# Patient Record
Sex: Male | Born: 1940
Health system: Southern US, Community
[De-identification: ages and names within clinical notes are randomized; demographics above are authoritative.]

## PROBLEM LIST (undated history)

## (undated) DIAGNOSIS — E119 Type 2 diabetes mellitus without complications: Secondary | ICD-10-CM

## (undated) DIAGNOSIS — Z87442 Personal history of urinary calculi: Secondary | ICD-10-CM

## (undated) DIAGNOSIS — E039 Hypothyroidism, unspecified: Secondary | ICD-10-CM

## (undated) DIAGNOSIS — I10 Essential (primary) hypertension: Secondary | ICD-10-CM

## (undated) DIAGNOSIS — N400 Enlarged prostate without lower urinary tract symptoms: Secondary | ICD-10-CM

## (undated) DIAGNOSIS — Q043 Other reduction deformities of brain: Secondary | ICD-10-CM

## (undated) DIAGNOSIS — E7800 Pure hypercholesterolemia, unspecified: Secondary | ICD-10-CM

## (undated) HISTORY — PX: PARATHYROIDECTOMY: SHX19

## (undated) HISTORY — PX: CHOLECYSTECTOMY: SHX55

## (undated) HISTORY — PX: LITHOTRIPSY: SUR834

---

## 2002-04-03 ENCOUNTER — Encounter: Payer: Self-pay | Admitting: Emergency Medicine

## 2002-04-03 ENCOUNTER — Emergency Department (HOSPITAL_COMMUNITY): Admission: EM | Admit: 2002-04-03 | Discharge: 2002-04-03 | Payer: Self-pay | Admitting: Emergency Medicine

## 2003-11-24 ENCOUNTER — Ambulatory Visit (HOSPITAL_COMMUNITY): Admission: RE | Admit: 2003-11-24 | Discharge: 2003-11-24 | Payer: Self-pay | Admitting: Gastroenterology

## 2004-05-25 ENCOUNTER — Emergency Department (HOSPITAL_COMMUNITY): Admission: EM | Admit: 2004-05-25 | Discharge: 2004-05-25 | Payer: Self-pay | Admitting: Emergency Medicine

## 2004-05-30 ENCOUNTER — Ambulatory Visit (HOSPITAL_COMMUNITY): Admission: RE | Admit: 2004-05-30 | Discharge: 2004-05-30 | Payer: Self-pay | Admitting: Urology

## 2004-11-25 ENCOUNTER — Emergency Department (HOSPITAL_COMMUNITY): Admission: EM | Admit: 2004-11-25 | Discharge: 2004-11-25 | Payer: Self-pay | Admitting: Emergency Medicine

## 2005-04-28 ENCOUNTER — Encounter (HOSPITAL_COMMUNITY): Admission: RE | Admit: 2005-04-28 | Discharge: 2005-07-08 | Payer: Self-pay | Admitting: Surgery

## 2005-06-30 ENCOUNTER — Ambulatory Visit (HOSPITAL_COMMUNITY): Admission: RE | Admit: 2005-06-30 | Discharge: 2005-07-01 | Payer: Self-pay | Admitting: Surgery

## 2005-06-30 ENCOUNTER — Encounter (INDEPENDENT_AMBULATORY_CARE_PROVIDER_SITE_OTHER): Payer: Self-pay | Admitting: Specialist

## 2005-07-14 HISTORY — PX: ANTERIOR CERVICAL DECOMP/DISCECTOMY FUSION: SHX1161

## 2006-02-17 ENCOUNTER — Encounter: Admission: RE | Admit: 2006-02-17 | Discharge: 2006-02-17 | Payer: Self-pay | Admitting: Internal Medicine

## 2006-03-20 ENCOUNTER — Inpatient Hospital Stay (HOSPITAL_COMMUNITY): Admission: RE | Admit: 2006-03-20 | Discharge: 2006-03-21 | Payer: Self-pay | Admitting: Neurological Surgery

## 2006-04-07 ENCOUNTER — Encounter: Admission: RE | Admit: 2006-04-07 | Discharge: 2006-04-07 | Payer: Self-pay | Admitting: Neurological Surgery

## 2006-06-01 ENCOUNTER — Encounter: Admission: RE | Admit: 2006-06-01 | Discharge: 2006-06-01 | Payer: Self-pay | Admitting: Neurological Surgery

## 2008-01-29 ENCOUNTER — Emergency Department (HOSPITAL_COMMUNITY): Admission: EM | Admit: 2008-01-29 | Discharge: 2008-01-29 | Payer: Self-pay | Admitting: Emergency Medicine

## 2010-11-26 NOTE — Consult Note (Signed)
[name], [name] NO.:  [id number]   MEDICAL RECORD NO.:  [id number]          PATIENT TYPE:  EMS   LOCATION:  MAJO                         FACILITY:  [facility]   PHYSICIAN:  Nena Polio, MD DATE OF BIRTH:  [birth year]   DATE OF CONSULTATION:  01/29/2008  DATE OF DISCHARGE:  01/29/2008                                 CONSULTATION   REASON FOR CONSULTATION:  Seen in consultation at the request of Dr.  Bebe Shaggy for further evaluation of a facial droop.   HISTORY OF PRESENT ILLNESS:  The patient is a very pleasant 70 year old  gentleman who came to the emergency room tonight with a 3-day history of  a left facial droop.  He noticed that initially he had a metallic taste  predominantly over the left side of his tongue.  He also developed some  pain over the left ear and mastoid area as well as some tearing in the  left eye.  Because his symptoms have persisted he came to the emergency  room tonight.  He denies any other symptoms of weakness, numbness,  speech, or swallowing changes.  The patient had a head CT done in the  emergency room which does show a small punctate hyperdensity in the  right Sylvian fissure that was thought to potentially represent blood in  the right NCA; however, this was very nonspecific per radiology.   CURRENT MEDICATIONS:  Accuretic, Serzone, and Synthroid.   PAST MEDICAL HISTORY:  1. Hypertension.  2. Hypothyroidism.  3. 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  4. History of a pinched nerve in the cervical spine, status post      surgery two years ago.  He has subsequent persistent numbness in      the left first 3 digits of the left hand.   SOCIAL/FAMILY HISTORY:  He is married.  He is retired.  They live here  in [place].  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   FAMILY HISTORY:  Benign.  His mother is still alive at 4, who did have  a stroke in her older age.  She is still living at home.   REVIEW OF SYSTEMS:   Please see H&P.  The remainder of 10 systems is  negative.   PHYSICAL EXAMINATION:  GENERAL:  A very pleasant gentleman in no acute  distress.  VITAL SIGNS:  He is afebrile.  Blood pressure is 140/80, pulse 53,  respirations 16.  HEENT:  Head is normocephalic, atraumatic.  Oropharynx moist with  exudate.  NECK:  Supple.  No carotid bruits.  HEART:  Regular rhythm, S1/S2.  ABDOMEN:  Soft, nondistended.  EXTREMITIES:  Showed no edema.  SKIN:  Warm, dry and without rash.  NEUROLOGIC:  The patient was alert and oriented x3.  His speech is  fluent.  He has a very mild dysarthria.  On cranial nerve exam his  pupils are reactive to light.  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  He has  good vertical movements bilaterally.  His facial sensation is intact.  He does have a mild to moderate peripheral seventh palsy with weakness  of all three distributions including weakness of eye closure and  decreased blinking on the left.  His sensation to light touch on the  tongue was intact.  Hearing was otherwise intact grossly.  Palate  elevation was symmetric, shoulder shrug intact.  Tongue was midline.  On  motor exam the patient has normal tone and bulk throughout.  He had 5/5  strength proximally and distally in all major muscle groups.  Sensory  exam was intact in all of his extremities with the exception of the  first three digits of the left hand, mild decrease to light touch and  temperature.  His reflexes were symmetric and nonpathologic.  Gait was  unremarkable.   IMPRESSION:  1. Left peripheral seventh palsy.  2. Abnormal finding on MRI that is likely incidental.  I do not see      any other symptoms on his exam to suggest a central ischemia at      this time.   RECOMMENDATIONS:  At this time I would treat for Bell palsy with  prednisone plus or minus acyclovir.  I would give him an eye patch at  night to prevent his left  eye from becoming dry and irritated.  If the  patient gets worse and/or his symptoms persist over the next three weeks  I would recommend follow-up with Dr. Terrace Arabia at [facility].  Otherwise he can follow up with his primary care physician as scheduled.   It was a pleasure seeing [name] this evening.      Nena Polio, MD  Electronically Signed     DEE/MEDQ  D:  01/29/2008  T:  01/30/2008  Job:  [id number]   cc:   Theressa Millard, M.D.  Levert Feinstein, MD

## 2010-11-29 NOTE — Op Note (Signed)
Peter Aguilar, Peter Aguilar             ACCOUNT NO.:  [id number]   MEDICAL RECORD NO.:  [id number]          PATIENT TYPE:  AMB   LOCATION:  DAY                          FACILITY:  [facility]   PHYSICIAN:  Claudette Laws, M.D.  DATE OF BIRTH:  Oct 11, 1940   DATE OF PROCEDURE:  05/30/2004  DATE OF DISCHARGE:                                 OPERATIVE REPORT   PREOPERATIVE DIAGNOSIS:  Middle left ureteral calculus with hydronephrosis  and renal colic.   POSTOPERATIVE DIAGNOSIS:  Middle left ureteral calculus with hydronephrosis  and renal colic.   OPERATION:  Cystoscopy and insertion of a 6 French 26 cm double-J stent.   SURGEON:  Claudette Laws, M.D.   PROCEDURE:  The patient was prepped and draped in the dorsal lithotomy  position under LMA anesthesia.  Cystoscopy was performed with a 22 French  rigid cystoscope.  He had a normal anterior urethra.  He had some early BPH  with hemorrhagic kissing lateral lobes for about 1 cm.  There was some  rather enlarged median lobe.  The bladder itself looked grossly normal.  No  tumors.  No calculi.  Normal ureteral orifices.   Using an open-ended 6 French ureteral catheter, I passed up a 0.038 glide  wire.  Using fluoroscopic control, the glide wire was positioned in the left  renal pelvis.  We then back out the open-ended catheter  and then passed up  a 6 French 26 cm double-J stent without a string attached.  It was  positioned in the pelvis and the bladder.  The bladder was emptied.  I then  passed a 16 French Silastic Foley catheter for his upcoming lithotripsy and  also to prevent retention.  A B&O suppository was placed per rectum for  anesthetic purposes.  He was then taken back to the recovery room in  satisfactory condition.     [name]   RFS/MEDQ  D:  05/30/2004  T:  05/30/2004  Job:  [id number]

## 2010-11-29 NOTE — Op Note (Signed)
Peter Aguilar, Peter Aguilar             ACCOUNT NO.:  [id number]   MEDICAL RECORD NO.:  [id number]          PATIENT TYPE:  AMB   LOCATION:  SDS                          FACILITY:  [facility]   PHYSICIAN:  Velora Heckler, MD      DATE OF BIRTH:  28-Nov-1940   DATE OF PROCEDURE:  06/30/2005  DATE OF DISCHARGE:                                 OPERATIVE REPORT   PREOPERATIVE DIAGNOSIS:  Primary hyperparathyroidism.   POSTOPERATIVE DIAGNOSES:  1.  Primary hyperparathyroidism.  2.  Hurthle cell, neoplasm right thyroid lobe.   PROCEDURE:  1.  Minimally invasive right inferior parathyroidectomy.  2.  Right thyroid lobectomy.   SURGEON:  Velora Heckler, MD   ASSISTANT:  Jimmye Norman, MD   ANESTHESIA:  General.   ESTIMATED BLOOD LOSS:  Minimal.   PREPARATION:  Betadine.   COMPLICATIONS:  None.   INDICATIONS:  The patient is a 70 year old white male from [place], [place].  He is referred by Dr. Theressa Millard for hypercalcemia.  The  patient had noted a gradual increase in serum calcium levels over several  years.  He has had a history of by bilateral nephrolithiasis.  The patient  now comes to surgery for neck exploration for parathyroidectomy.  Preoperative sestamibi scan localized the adenoma to the right superior  position.   BODY OF REPORT:  Procedure is done in OR #1 at the [facility].  The patient is brought to the operating room and placed in a  supine position on the operating room table.  Following the administration  of general anesthesia, the patient is scanned with the Neoprobe.  An area of  increased radioactivity is noted over the right thyroid lobe.  This is  marked on the skin.  The patient is then prepped and draped in the usual  strict aseptic fashion.  Skin incision is made with a #15 blade and carried  down through subcutaneous tissues and platysma.  Hemostasis is obtained with  the electrocautery.  Subplatysmal flaps are elevated with  the  electrocautery.  Strap muscles are incised in the midline and reflected to  the right.  Right thyroid lobe is exposed.  Using the Neoprobe for guidance,  the right lobe is mobilized.  Exploration along the lateral aspect of the  thyroid lobe, the posterior aspect of the lobe, the tracheoesophageal  groove, and the carotid sheath failed to reveal any obvious evidence of  parathyroid adenoma.  Therefore, further scanning continues to localize  markedly increased activity within the substance of the inferior pole of the  right thyroid lobe.  There is a palpable mass.  Decision is made to proceed  with right thyroid lobectomy.  Superior pole vessels are dissected out,  ligated in continuity with 2-0 silk ties and medium Ligaclips and divided.  Inferior venous tributaries are divided between small and medium Ligaclips.  Gland is rolled anteriorly.  Branches of the inferior thyroid artery are  divided between small Ligaclips.  Dissection is carried down to the ligament  of Allyson Sabal, which is ultimately transected with the electrocautery.  Recurrent  nerve is identified and preserved.  Gland is rolled anteriorly up and onto  the anterior trachea.  Isthmus is mobilized across the midline.  The isthmus  is transected between hemostats and suture-ligated with 3-0 Vicryl suture  ligatures.  Ex vivo, the right thyroid lobe is scanned.  There are excess  radioactivity counts in the inferior pole in excess of 200% of background.  The gland is sectioned on the table.  There is a complex lesion in the  inferior pole.  Cystic fluid is evacuated.  It has a thick fibrous capsule.  Tissue appears to represent parathyroid tissue.  A suture is used to mark  the lesion and it is submitted to Pathology for review.  It measures 1.6 cm  in dimension.  It is completely intrathyroidal.  Frozen section analysis by  Dr. Charlott Rakes shows a Hurthle cell neoplasm.  Parathyroid was not  identified.  Further exploration  in the right neck using the Neoprobe for  guidance shows increased counts inferiorly.  The thyrothymic tract is  explored into the mediastinum.  Within the thyrothymic tract, a parathyroid  adenoma is visualized.  This is gently mobilized with retraction in gentle  dissection.  Vascular structures are divided between small Ligaclips.  Gland  is completely excised.  It measures 2.1 cm in size.  It is submitted to  Pathology, where frozen section confirms parathyroid adenoma.  Good  hemostasis is obtained in the right neck.  Surgicel is placed over the area  of the recurrent nerve.  Strap muscles are reapproximated in the midline  with interrupted 3-0 Vicryl sutures.  Platysma is closed with interrupted 3-  0 Vicryl sutures.  Skin is closed with a running 4-0 Vicryl subcuticular  suture.  Wound is washed and dried, and Benzoin and Steri-Strips are  applied.  Sterile dressings are applied.  The patient is awakened from  anesthesia and brought to the recovery room in stable condition.  The  patient tolerated the procedure well.      Velora Heckler, MD  Electronically Signed     TMG/MEDQ  D:  06/30/2005  T:  07/02/2005  Job:  [id number]   cc:   Theressa Millard, M.D.  Fax: [phone]   Claudette Laws, M.D.  Fax: [phone]   Ronaldo Miyamoto A. Maple Hudson, M.D.  Fax: [phone]

## 2010-11-29 NOTE — Op Note (Signed)
NAMEROSBEL, BUCKNER NO.:  [id number]   MEDICAL RECORD NO.:  [id number]          PATIENT TYPE:  OIB   LOCATION:  3015                         FACILITY:  [facility]   PHYSICIAN:  Tia Alert, MD     DATE OF BIRTH:  [birth year]   DATE OF PROCEDURE:  03/20/2006  DATE OF DISCHARGE:                                 OPERATIVE REPORT   PREOPERATIVE DIAGNOSIS:  Anterior cervical wound hematoma.   POSTOPERATIVE DIAGNOSIS:  Soft tissue edema with some seroma-like fluid.   PROCEDURE:  Anterior cervical re-exploration with removal of seroma and  placement of a Jackson-Pratt drain.   SURGEON:  Marikay Alar, MD   ANESTHESIA:  General endotracheal.   COMPLICATIONS:  None apparent.   INDICATIONS FOR THE PROCEDURE:  Mr. Bramer is a 70 year old white male who  underwent a two-level ACDF with plating yesterday at C3-4 and at C5-6.  This  morning, he had progressive worsening difficulty with swallowing.  He had no  difficulty with breathing.  His voice was mildly course.  He was in no  distress.  His wound had soft swelling to it that was ballottable and we  felt like he had a soft tissue hematoma.  Because of his progressive  symptoms, we recommended an anterior cervical re-exploration with removal of  this.  He understood the risks, benefits and alternatives and wished to  proceed.   DESCRIPTION OF PROCEDURE:  The patient was taken to the operating room and  after induction of adequate generalized endotracheal anesthesia, he was  placed in a supine position on the operating room table and his right  anterior cervical region was prepped with DuraPrep and then draped in the  usual sterile fashion.  Five milliliters of local anesthesia were injected  and his old incision was opened and then the sutures in the platysma were  cut.  There was some release of a seroma-like pink fluid.  The tissues were  somewhat edematous.  I used handheld retractors to dissect down to the  prevertebral space.  Again, there was no thick postoperative hematoma found.  I found no bleeding points.  We irrigated with saline solution containing  bacitracin, inspected our plate, inspected our construct, inspected  underneath the esophagus and along the carotid, again found no bleeding  points.  I then placed a 7 flat JP drain through a separate stab incision  and proceeded to close the platysma with 3-0 Vicryl, closed the subcuticular  tissues with 3-0 Vicryl and closed the skin with Benzoin and Steri-Strips.  The drapes were remove and a sterile dressing was applied.  The patient was  awakened from general anesthesia and transferred to the recovery room in  stable condition.  At the end of the procedure, all sponge, needle and  instrument counts were correct.      Tia Alert, MD  Electronically Signed    DSJ/MEDQ  D:  03/20/2006  T:  03/21/2006  Job:  [id number]

## 2010-11-29 NOTE — Op Note (Signed)
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   PROCEDURE:  1. Decompressive anterior cervical diskectomy C3-C4 and C5-C6 for cervical      canal decompression.  2. Anterior cervical arthrodesis C3-C4 and C5-C6 utilizing an 8-mm      corticocancellous allograft at C3-C4 and a 7-mm corticocancellous      allograft at C5-C6.  3. Anterior cervical plating C3-C4 and C5-C6 utilizing 25-mm Venture      plates.   SURGEON:  Tia Alert, M.D.   ASSISTANT:  Donalee Citrin, M.D.   ANESTHESIA:  General endotracheal.   COMPLICATIONS:  None apparent.   INDICATIONS FOR PROCEDURE:  Peter Aguilar is a 70 year old white male who was  referred with numbness in his left arm and hand.  He was found on MRI to  have cervical spondylosis with significant stenosis at C3-C4 with signal  change in the spinal cord.  At C5-C6, he had stenosis without signal change  in the cord but had foraminal stenosis on the left.  I recommended a  decompressive anterior cervical diskectomy, fusion, and plating at C3-C4 to  C5-C6.  He understood the risks, benefits, and expected outcome and wished  to proceed.   DESCRIPTION OF PROCEDURE:  The patient was taken to the operating room and  after induction of adequate general endotracheal anesthesia, he was placed  in the supine position on the operating room table. His right anterior  cervical region was prepped with Duraprep and then draped in the  usual  sterile fashion.  Then, 5 cc of local anesthesia was injected and a  transverse incision was made to the right of midline and carried down to the  platysma muscle which was elevated, opened, and undermined with Metzenbaum  scissors.  I then dissected a plane medial to the sternocleidomastoid muscle  and internal carotid artery and lateral to the trachea and esophagus to  expose C5-C6 and C3-C4.  I localized each disk level with intraoperative  fluoroscopy and took down the longus coli muscles bilaterally to expose C3-  C4 and C5-C6.  I started at the C5-C6 level and removed the anterior  osteophytes with a Leksell rongeur and then incised the disk space.  The  disk space was quite collapsed.  I used the high-speed drill to drill the  end plates and to widen the disk space in a rectangular fashion down to the  level of the posterior longitudinal ligament.  The operating microscope was  brought into the field and the posterior longitudinal ligament was opened  with a  nerve hook and then removed in a circumferential fashion by  undercutting the bodies of C5 and C6.  Bilateral foraminotomies were  performed, paying particular attention to the left side because of his left-  sided symptoms.  A generous foraminotomy was performed over the C6 nerve  root on the left side following it out past the pedicle level. I could  palpate the medial and superior part of the pedicle and follow the nerve  root out along this.  He had significant foraminal stenosis, especially from  a spur coming from the superior end plate.  This was removed with the 2-mm  Kerrison punch.  Once the decompression was complete, we irrigated with  saline solution and inspected our decompression.  We felt like we had a good  central decompression, as well as bilateral foraminotomies.  We dried the  surgical bed, measured the interspace to be 7 mm, and used a 7-mm  corticocancellous allograft and tapped this into position at  C5-C6.  We then  used a 25-mm Venture plate and placed two 13-mm variable-angle screws in the  bodies of C5 and C6.  These were locked into the plate by the locking  mechanisms over the plate.  We then turned our attention to the C3-C4 level.  We placed the Shadowline retractors under the longus coli muscles, incised  the disk space, and performed the initial diskectomy with pituitary rongeurs  and curved curets.  We then used the high-speed drill to drill the end  plates and to widen the disk space down to the level of the posterior  longitudinal ligament.  There was a very large posterior spur which was  drilled and then removed with the 1-mm Kerrison punch.  This was coming off  the inferior end plate of C3.  We then opened the posterior longitudinal  ligament with a nerve hook and removed it in a circumferential fashion by  undercutting the bodies of C3 and C4.  We were very careful to undercut each  body to decompress the central canal.  We could see the cord pulsatile  through the dura.  We angled our microscope to look under the body of C3 and  under the of C4 to make sure we had a good decompression of both the  superior and inferior end plates.  We then performed bilateral  foraminotomies.  Once our decompression was complete, we dried the surgical  bed with Gelfoam, irrigated with saline solution containing Bacitracin,  measured the interspace to be 8 mm, and used an 8-mm corticocancellous  allograft and tapped it into position at C3-C4.  We then used another 25-mm  Venture plate and placed two 13-mm variable-angle screws into the bodies of  C3 and C4.  These locked into position again by the locking mechanism and  the plate.  We then irrigated with copious amounts of Bacitracin-containing  saline solution, dried the bleeding points with bipolar cautery.  Once  meticulous hemostasis was achieved, closed the platysma with 3-0 Vicryl, closed the subcuticular space with 3-0 Vicryl,  and closed the skin with  benzoin and Steri-Strips.  The drapes were removed.  Sterile dressing was  applied.  The patient was awakened from general anesthesia and transferred  to the recovery room in stable condition.  At the end of the procedure, all  sponge, needle, and instrument counts were correct.      Tia Alert, MD  Electronically Signed     DSJ/MEDQ  D:  03/19/2006  T:  03/19/2006  Job:  [id number]

## 2011-03-25 ENCOUNTER — Emergency Department (HOSPITAL_COMMUNITY): Payer: BC Managed Care – PPO

## 2011-03-25 ENCOUNTER — Emergency Department (HOSPITAL_COMMUNITY)
Admission: EM | Admit: 2011-03-25 | Discharge: 2011-03-25 | Disposition: A | Discharge status: Home / Self Care | Payer: BC Managed Care – PPO | Attending: Emergency Medicine | Admitting: Emergency Medicine

## 2011-03-25 DIAGNOSIS — I10 Essential (primary) hypertension: Secondary | ICD-10-CM | POA: Insufficient documentation

## 2011-03-25 DIAGNOSIS — D72829 Elevated white blood cell count, unspecified: Secondary | ICD-10-CM | POA: Insufficient documentation

## 2011-03-25 DIAGNOSIS — N201 Calculus of ureter: Secondary | ICD-10-CM | POA: Insufficient documentation

## 2011-03-25 DIAGNOSIS — R109 Unspecified abdominal pain: Secondary | ICD-10-CM | POA: Insufficient documentation

## 2011-03-25 DIAGNOSIS — E039 Hypothyroidism, unspecified: Secondary | ICD-10-CM | POA: Insufficient documentation

## 2011-03-25 LAB — DIFFERENTIAL
Basophils Absolute: 0 10*3/uL (ref 0.0–0.1)
Basophils Relative: 0 % (ref 0–1)
Eosinophils Absolute: 0 10*3/uL (ref 0.0–0.7)
Eosinophils Relative: 0 % (ref 0–5)
Lymphocytes Relative: 9 % — ABNORMAL LOW (ref 12–46)
Lymphs Abs: 1.5 10*3/uL (ref 0.7–4.0)
Monocytes Absolute: 0.9 10*3/uL (ref 0.1–1.0)
Monocytes Relative: 6 % (ref 3–12)
Neutro Abs: 14 10*3/uL — ABNORMAL HIGH (ref 1.7–7.7)
Neutrophils Relative %: 85 % — ABNORMAL HIGH (ref 43–77)

## 2011-03-25 LAB — URINE MICROSCOPIC-ADD ON

## 2011-03-25 LAB — URINALYSIS, ROUTINE W REFLEX MICROSCOPIC
Bilirubin Urine: NEGATIVE
Glucose, UA: NEGATIVE mg/dL
Ketones, ur: 40 mg/dL — AB
Nitrite: POSITIVE — AB
Protein, ur: 100 mg/dL — AB
Specific Gravity, Urine: 1.02 (ref 1.005–1.030)
Urobilinogen, UA: 0.2 mg/dL (ref 0.0–1.0)
pH: 6 (ref 5.0–8.0)

## 2011-03-25 LAB — CBC
HCT: 47.3 % (ref 39.0–52.0)
Hemoglobin: 17.1 g/dL — ABNORMAL HIGH (ref 13.0–17.0)
MCH: 31.6 pg (ref 26.0–34.0)
MCHC: 36.2 g/dL — ABNORMAL HIGH (ref 30.0–36.0)
MCV: 87.4 fL (ref 78.0–100.0)
Platelets: 197 10*3/uL (ref 150–400)
RBC: 5.41 MIL/uL (ref 4.22–5.81)
RDW: 13.2 % (ref 11.5–15.5)
WBC: 16.5 10*3/uL — ABNORMAL HIGH (ref 4.0–10.5)

## 2011-03-26 LAB — URINE CULTURE
Colony Count: 50000
Culture  Setup Time: 201209120200

## 2011-04-03 ENCOUNTER — Ambulatory Visit (HOSPITAL_COMMUNITY)
Admission: RE | Admit: 2011-04-03 | Discharge: 2011-04-03 | Disposition: A | Discharge status: Home / Self Care | Payer: BC Managed Care – PPO | Source: Ambulatory Visit | Attending: Urology | Admitting: Urology

## 2011-04-03 DIAGNOSIS — N201 Calculus of ureter: Secondary | ICD-10-CM | POA: Insufficient documentation

## 2011-04-03 DIAGNOSIS — I10 Essential (primary) hypertension: Secondary | ICD-10-CM | POA: Insufficient documentation

## 2011-04-03 DIAGNOSIS — E119 Type 2 diabetes mellitus without complications: Secondary | ICD-10-CM | POA: Insufficient documentation

## 2011-09-02 DIAGNOSIS — R3129 Other microscopic hematuria: Secondary | ICD-10-CM | POA: Diagnosis not present

## 2011-09-02 DIAGNOSIS — N200 Calculus of kidney: Secondary | ICD-10-CM | POA: Diagnosis not present

## 2011-09-02 DIAGNOSIS — R82998 Other abnormal findings in urine: Secondary | ICD-10-CM | POA: Diagnosis not present

## 2011-10-28 DIAGNOSIS — G808 Other cerebral palsy: Secondary | ICD-10-CM | POA: Diagnosis not present

## 2011-12-24 DIAGNOSIS — E039 Hypothyroidism, unspecified: Secondary | ICD-10-CM | POA: Diagnosis not present

## 2011-12-24 DIAGNOSIS — F325 Major depressive disorder, single episode, in full remission: Secondary | ICD-10-CM | POA: Diagnosis not present

## 2011-12-24 DIAGNOSIS — I10 Essential (primary) hypertension: Secondary | ICD-10-CM | POA: Diagnosis not present

## 2011-12-24 DIAGNOSIS — E119 Type 2 diabetes mellitus without complications: Secondary | ICD-10-CM | POA: Diagnosis not present

## 2012-05-07 DIAGNOSIS — Z23 Encounter for immunization: Secondary | ICD-10-CM | POA: Diagnosis not present

## 2012-10-14 IMAGING — CR DG ABDOMEN 1V
2 series · 2 of 2 positions shown · non-contrast
Comparison: Radiographs dated 03/27/2011 and CT scan dated
03/25/2011

CLINICAL DATA: Right ureteral stones.  Pre lithotripsy.

ABDOMEN - 1 VIEW

[t abdomen supine (1 of 2)]
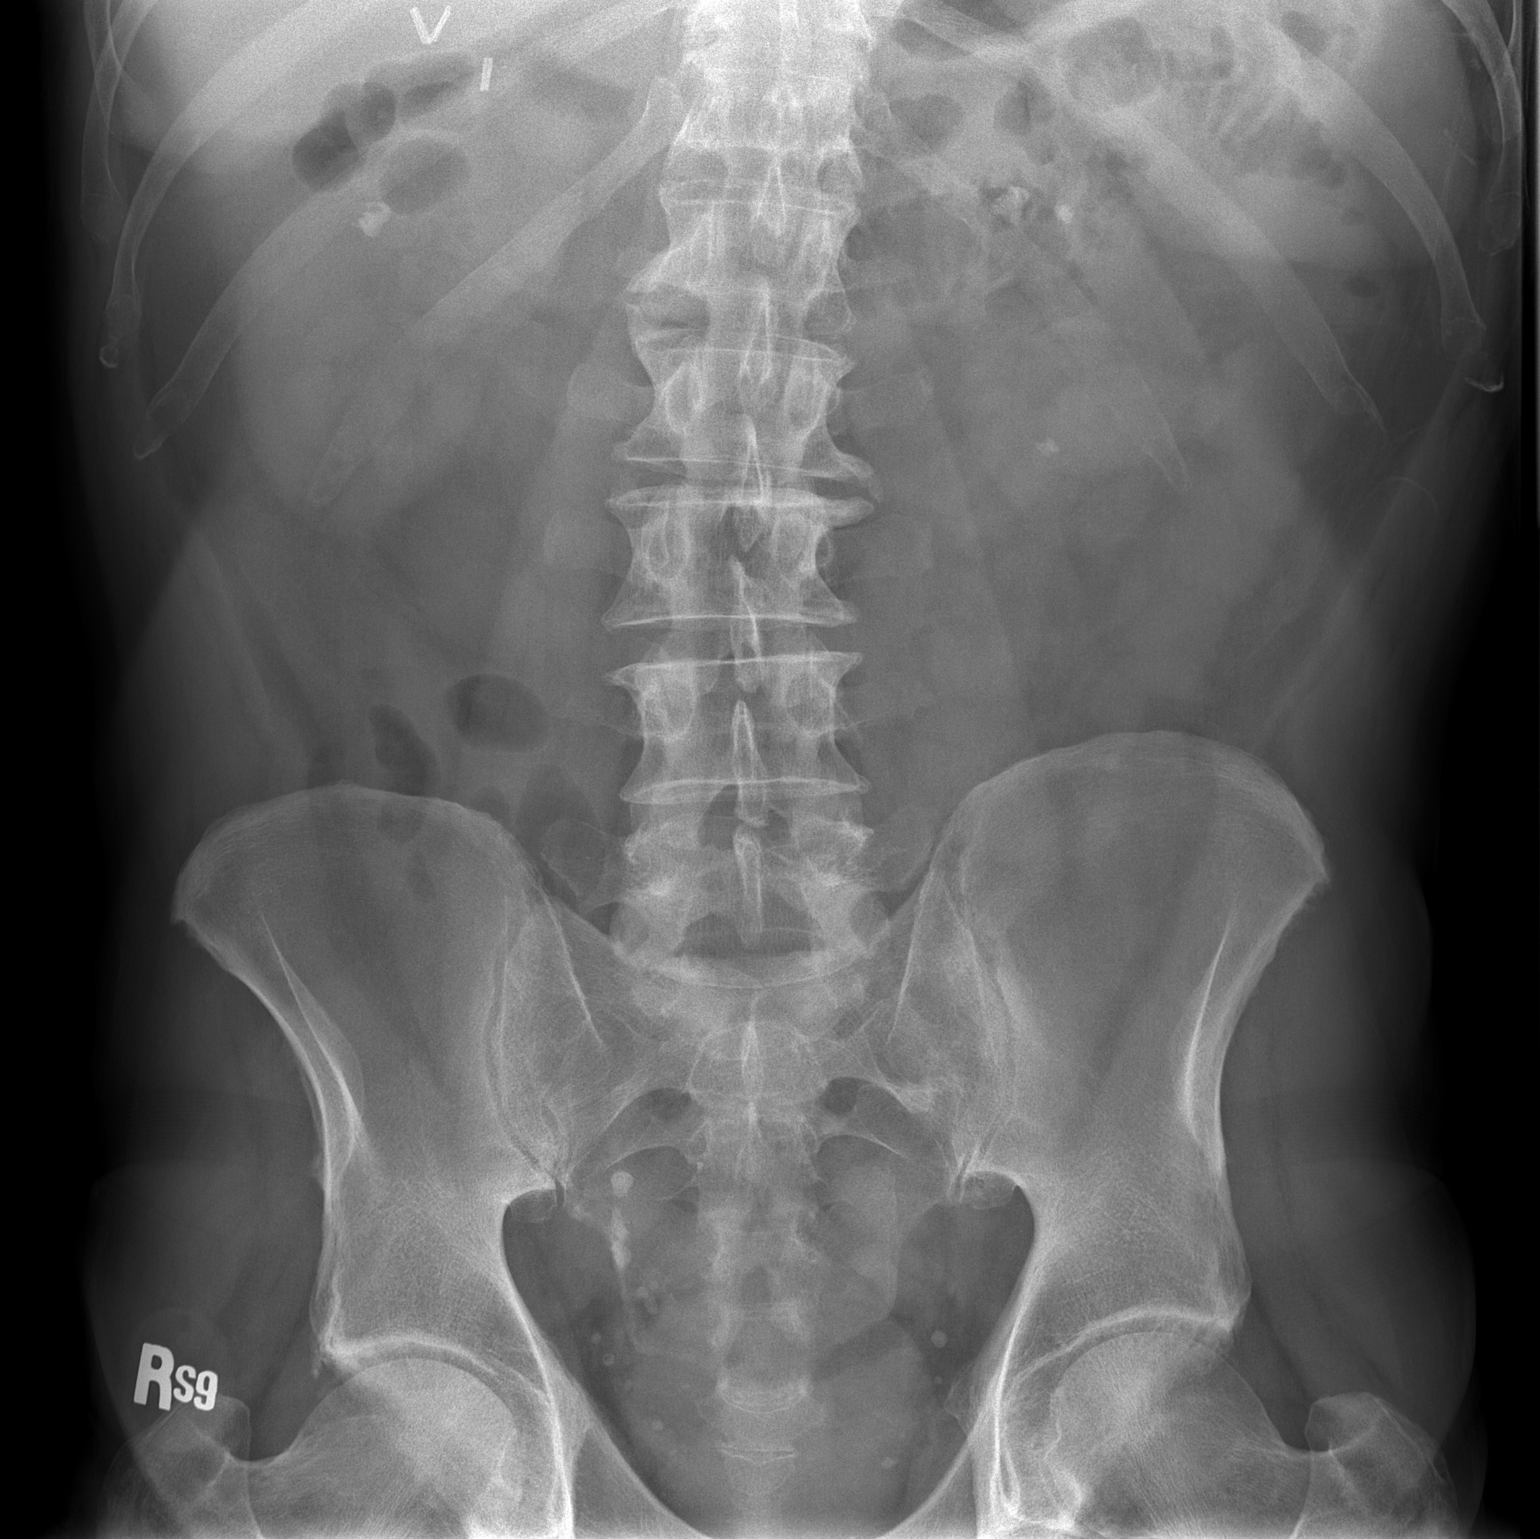

[t abdomen supine (2 of 2)]
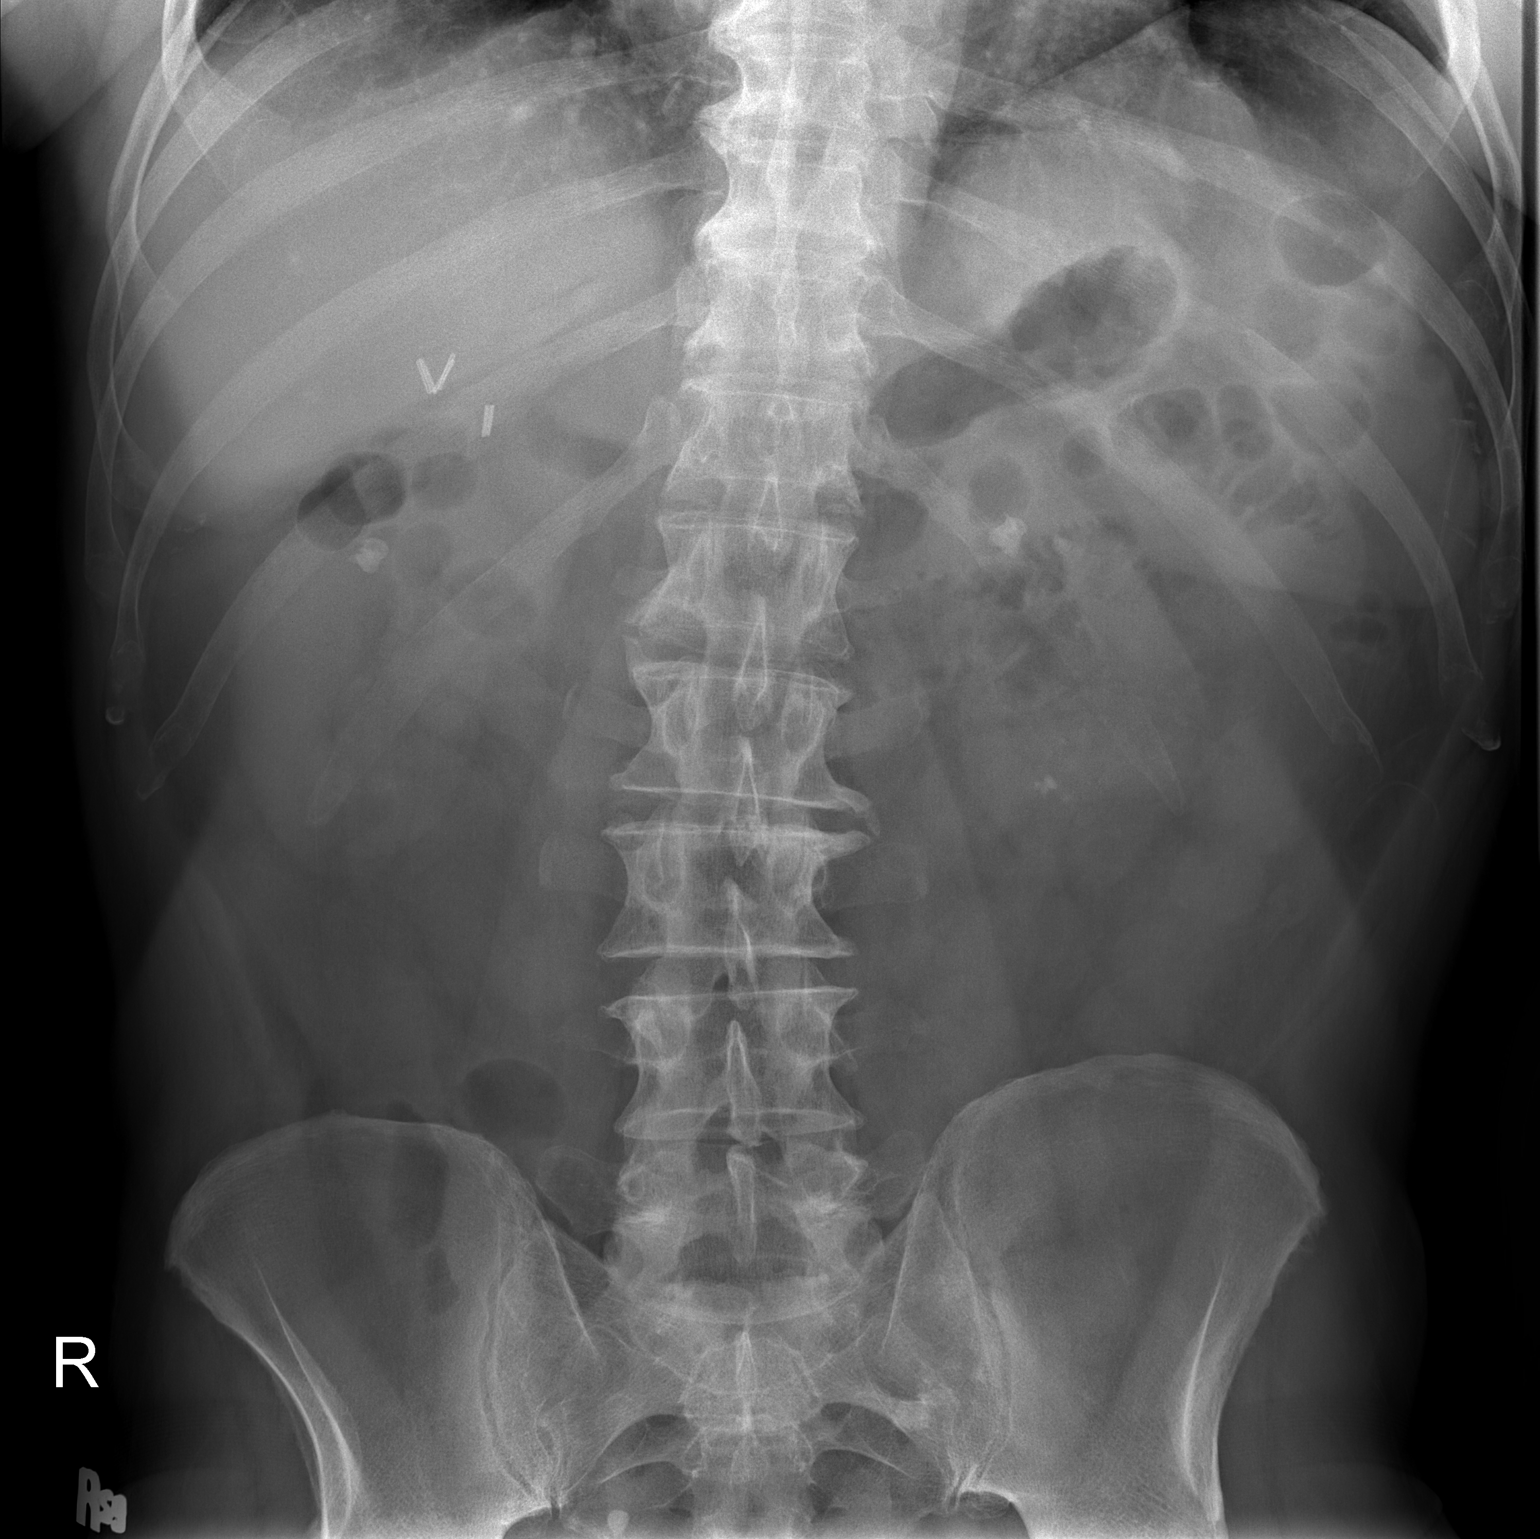

[2 of 2 positions shown; findings below may reference images not displayed]

FINDINGS: Multiple distal right ureteral stones are noted. The more
proximal larger stone has moved distally closer to the more distal
stones.

Bilateral renal calculi are noted.

Bowel gas pattern is normal.  No acute osseous abnormality.
IMPRESSION: Multiple distal right ureteral calculi as described.

## 2012-12-29 DIAGNOSIS — E039 Hypothyroidism, unspecified: Secondary | ICD-10-CM | POA: Diagnosis not present

## 2012-12-29 DIAGNOSIS — N182 Chronic kidney disease, stage 2 (mild): Secondary | ICD-10-CM | POA: Diagnosis not present

## 2012-12-29 DIAGNOSIS — E1129 Type 2 diabetes mellitus with other diabetic kidney complication: Secondary | ICD-10-CM | POA: Diagnosis not present

## 2012-12-29 DIAGNOSIS — Z23 Encounter for immunization: Secondary | ICD-10-CM | POA: Diagnosis not present

## 2012-12-29 DIAGNOSIS — F325 Major depressive disorder, single episode, in full remission: Secondary | ICD-10-CM | POA: Diagnosis not present

## 2012-12-29 DIAGNOSIS — I10 Essential (primary) hypertension: Secondary | ICD-10-CM | POA: Diagnosis not present

## 2013-06-21 DIAGNOSIS — I10 Essential (primary) hypertension: Secondary | ICD-10-CM | POA: Diagnosis not present

## 2013-06-21 DIAGNOSIS — E1129 Type 2 diabetes mellitus with other diabetic kidney complication: Secondary | ICD-10-CM | POA: Diagnosis not present

## 2013-06-21 DIAGNOSIS — N182 Chronic kidney disease, stage 2 (mild): Secondary | ICD-10-CM | POA: Diagnosis not present

## 2013-12-01 DIAGNOSIS — R42 Dizziness and giddiness: Secondary | ICD-10-CM | POA: Diagnosis not present

## 2013-12-20 DIAGNOSIS — I10 Essential (primary) hypertension: Secondary | ICD-10-CM | POA: Diagnosis not present

## 2013-12-20 DIAGNOSIS — E1129 Type 2 diabetes mellitus with other diabetic kidney complication: Secondary | ICD-10-CM | POA: Diagnosis not present

## 2013-12-20 DIAGNOSIS — E039 Hypothyroidism, unspecified: Secondary | ICD-10-CM | POA: Diagnosis not present

## 2013-12-20 DIAGNOSIS — N182 Chronic kidney disease, stage 2 (mild): Secondary | ICD-10-CM | POA: Diagnosis not present

## 2014-04-21 DIAGNOSIS — Z23 Encounter for immunization: Secondary | ICD-10-CM | POA: Diagnosis not present

## 2014-06-27 DIAGNOSIS — E119 Type 2 diabetes mellitus without complications: Secondary | ICD-10-CM | POA: Diagnosis not present

## 2014-06-27 DIAGNOSIS — E039 Hypothyroidism, unspecified: Secondary | ICD-10-CM | POA: Diagnosis not present

## 2014-06-27 DIAGNOSIS — I10 Essential (primary) hypertension: Secondary | ICD-10-CM | POA: Diagnosis not present

## 2014-06-27 DIAGNOSIS — Z23 Encounter for immunization: Secondary | ICD-10-CM | POA: Diagnosis not present

## 2015-09-12 DIAGNOSIS — E1122 Type 2 diabetes mellitus with diabetic chronic kidney disease: Secondary | ICD-10-CM | POA: Diagnosis not present

## 2015-09-12 DIAGNOSIS — E039 Hypothyroidism, unspecified: Secondary | ICD-10-CM | POA: Diagnosis not present

## 2015-09-12 DIAGNOSIS — Z7984 Long term (current) use of oral hypoglycemic drugs: Secondary | ICD-10-CM | POA: Diagnosis not present

## 2015-09-12 DIAGNOSIS — I10 Essential (primary) hypertension: Secondary | ICD-10-CM | POA: Diagnosis not present

## 2015-09-12 DIAGNOSIS — E782 Mixed hyperlipidemia: Secondary | ICD-10-CM | POA: Diagnosis not present

## 2015-09-12 DIAGNOSIS — N182 Chronic kidney disease, stage 2 (mild): Secondary | ICD-10-CM | POA: Diagnosis not present

## 2015-09-12 DIAGNOSIS — F322 Major depressive disorder, single episode, severe without psychotic features: Secondary | ICD-10-CM | POA: Diagnosis not present

## 2016-03-18 DIAGNOSIS — E782 Mixed hyperlipidemia: Secondary | ICD-10-CM | POA: Diagnosis not present

## 2016-03-18 DIAGNOSIS — Z0000 Encounter for general adult medical examination without abnormal findings: Secondary | ICD-10-CM | POA: Diagnosis not present

## 2016-03-18 DIAGNOSIS — E213 Hyperparathyroidism, unspecified: Secondary | ICD-10-CM | POA: Diagnosis not present

## 2016-03-18 DIAGNOSIS — E781 Pure hyperglyceridemia: Secondary | ICD-10-CM | POA: Diagnosis not present

## 2016-03-18 DIAGNOSIS — Z125 Encounter for screening for malignant neoplasm of prostate: Secondary | ICD-10-CM | POA: Diagnosis not present

## 2016-03-18 DIAGNOSIS — E039 Hypothyroidism, unspecified: Secondary | ICD-10-CM | POA: Diagnosis not present

## 2016-03-18 DIAGNOSIS — N182 Chronic kidney disease, stage 2 (mild): Secondary | ICD-10-CM | POA: Diagnosis not present

## 2016-03-18 DIAGNOSIS — I10 Essential (primary) hypertension: Secondary | ICD-10-CM | POA: Diagnosis not present

## 2016-03-18 DIAGNOSIS — Z1389 Encounter for screening for other disorder: Secondary | ICD-10-CM | POA: Diagnosis not present

## 2016-03-18 DIAGNOSIS — E1122 Type 2 diabetes mellitus with diabetic chronic kidney disease: Secondary | ICD-10-CM | POA: Diagnosis not present

## 2016-03-18 DIAGNOSIS — F322 Major depressive disorder, single episode, severe without psychotic features: Secondary | ICD-10-CM | POA: Diagnosis not present

## 2016-03-18 DIAGNOSIS — N400 Enlarged prostate without lower urinary tract symptoms: Secondary | ICD-10-CM | POA: Diagnosis not present

## 2016-08-22 DIAGNOSIS — H02839 Dermatochalasis of unspecified eye, unspecified eyelid: Secondary | ICD-10-CM | POA: Diagnosis not present

## 2016-08-22 DIAGNOSIS — H5005 Alternating esotropia: Secondary | ICD-10-CM | POA: Diagnosis not present

## 2016-08-22 DIAGNOSIS — I10 Essential (primary) hypertension: Secondary | ICD-10-CM | POA: Diagnosis not present

## 2016-08-22 DIAGNOSIS — E119 Type 2 diabetes mellitus without complications: Secondary | ICD-10-CM | POA: Diagnosis not present

## 2016-09-16 DIAGNOSIS — E213 Hyperparathyroidism, unspecified: Secondary | ICD-10-CM | POA: Diagnosis not present

## 2016-09-16 DIAGNOSIS — E1122 Type 2 diabetes mellitus with diabetic chronic kidney disease: Secondary | ICD-10-CM | POA: Diagnosis not present

## 2016-09-16 DIAGNOSIS — F322 Major depressive disorder, single episode, severe without psychotic features: Secondary | ICD-10-CM | POA: Diagnosis not present

## 2016-09-16 DIAGNOSIS — N183 Chronic kidney disease, stage 3 (moderate): Secondary | ICD-10-CM | POA: Diagnosis not present

## 2016-09-16 DIAGNOSIS — E782 Mixed hyperlipidemia: Secondary | ICD-10-CM | POA: Diagnosis not present

## 2016-09-16 DIAGNOSIS — Z1211 Encounter for screening for malignant neoplasm of colon: Secondary | ICD-10-CM | POA: Diagnosis not present

## 2016-09-16 DIAGNOSIS — Z7984 Long term (current) use of oral hypoglycemic drugs: Secondary | ICD-10-CM | POA: Diagnosis not present

## 2016-09-16 DIAGNOSIS — E039 Hypothyroidism, unspecified: Secondary | ICD-10-CM | POA: Diagnosis not present

## 2016-09-16 DIAGNOSIS — I10 Essential (primary) hypertension: Secondary | ICD-10-CM | POA: Diagnosis not present

## 2017-05-04 DIAGNOSIS — E213 Hyperparathyroidism, unspecified: Secondary | ICD-10-CM | POA: Diagnosis not present

## 2017-05-04 DIAGNOSIS — N183 Chronic kidney disease, stage 3 (moderate): Secondary | ICD-10-CM | POA: Diagnosis not present

## 2017-05-04 DIAGNOSIS — F322 Major depressive disorder, single episode, severe without psychotic features: Secondary | ICD-10-CM | POA: Diagnosis not present

## 2017-05-04 DIAGNOSIS — I10 Essential (primary) hypertension: Secondary | ICD-10-CM | POA: Diagnosis not present

## 2017-05-04 DIAGNOSIS — E039 Hypothyroidism, unspecified: Secondary | ICD-10-CM | POA: Diagnosis not present

## 2017-05-04 DIAGNOSIS — E1122 Type 2 diabetes mellitus with diabetic chronic kidney disease: Secondary | ICD-10-CM | POA: Diagnosis not present

## 2017-05-04 DIAGNOSIS — N200 Calculus of kidney: Secondary | ICD-10-CM | POA: Diagnosis not present

## 2017-05-04 DIAGNOSIS — N400 Enlarged prostate without lower urinary tract symptoms: Secondary | ICD-10-CM | POA: Diagnosis not present

## 2017-05-04 DIAGNOSIS — E782 Mixed hyperlipidemia: Secondary | ICD-10-CM | POA: Diagnosis not present

## 2017-05-04 DIAGNOSIS — Z7984 Long term (current) use of oral hypoglycemic drugs: Secondary | ICD-10-CM | POA: Diagnosis not present

## 2017-06-15 DIAGNOSIS — R3912 Poor urinary stream: Secondary | ICD-10-CM | POA: Diagnosis not present

## 2017-06-15 DIAGNOSIS — R3121 Asymptomatic microscopic hematuria: Secondary | ICD-10-CM | POA: Diagnosis not present

## 2017-06-15 DIAGNOSIS — R972 Elevated prostate specific antigen [PSA]: Secondary | ICD-10-CM | POA: Diagnosis not present

## 2017-06-15 DIAGNOSIS — N401 Enlarged prostate with lower urinary tract symptoms: Secondary | ICD-10-CM | POA: Diagnosis not present

## 2017-06-17 DIAGNOSIS — M5432 Sciatica, left side: Secondary | ICD-10-CM | POA: Diagnosis not present

## 2017-06-26 DIAGNOSIS — N200 Calculus of kidney: Secondary | ICD-10-CM | POA: Diagnosis not present

## 2017-06-26 DIAGNOSIS — R3121 Asymptomatic microscopic hematuria: Secondary | ICD-10-CM | POA: Diagnosis not present

## 2017-07-27 DIAGNOSIS — N401 Enlarged prostate with lower urinary tract symptoms: Secondary | ICD-10-CM | POA: Diagnosis not present

## 2017-07-27 DIAGNOSIS — R3912 Poor urinary stream: Secondary | ICD-10-CM | POA: Diagnosis not present

## 2017-07-27 DIAGNOSIS — R972 Elevated prostate specific antigen [PSA]: Secondary | ICD-10-CM | POA: Diagnosis not present

## 2017-07-27 DIAGNOSIS — N200 Calculus of kidney: Secondary | ICD-10-CM | POA: Diagnosis not present

## 2017-08-29 DIAGNOSIS — R69 Illness, unspecified: Secondary | ICD-10-CM | POA: Diagnosis not present

## 2017-09-14 DIAGNOSIS — N3000 Acute cystitis without hematuria: Secondary | ICD-10-CM | POA: Diagnosis not present

## 2017-09-29 DIAGNOSIS — R69 Illness, unspecified: Secondary | ICD-10-CM | POA: Diagnosis not present

## 2017-10-25 DIAGNOSIS — R69 Illness, unspecified: Secondary | ICD-10-CM | POA: Diagnosis not present

## 2017-11-09 DIAGNOSIS — E1122 Type 2 diabetes mellitus with diabetic chronic kidney disease: Secondary | ICD-10-CM | POA: Diagnosis not present

## 2017-11-09 DIAGNOSIS — N400 Enlarged prostate without lower urinary tract symptoms: Secondary | ICD-10-CM | POA: Diagnosis not present

## 2017-11-09 DIAGNOSIS — Z7984 Long term (current) use of oral hypoglycemic drugs: Secondary | ICD-10-CM | POA: Diagnosis not present

## 2017-11-09 DIAGNOSIS — N183 Chronic kidney disease, stage 3 (moderate): Secondary | ICD-10-CM | POA: Diagnosis not present

## 2017-11-09 DIAGNOSIS — I10 Essential (primary) hypertension: Secondary | ICD-10-CM | POA: Diagnosis not present

## 2017-11-09 DIAGNOSIS — E782 Mixed hyperlipidemia: Secondary | ICD-10-CM | POA: Diagnosis not present

## 2017-11-18 DIAGNOSIS — H5203 Hypermetropia, bilateral: Secondary | ICD-10-CM | POA: Diagnosis not present

## 2017-11-18 DIAGNOSIS — E119 Type 2 diabetes mellitus without complications: Secondary | ICD-10-CM | POA: Diagnosis not present

## 2017-11-18 DIAGNOSIS — H52223 Regular astigmatism, bilateral: Secondary | ICD-10-CM | POA: Diagnosis not present

## 2017-11-18 DIAGNOSIS — H2513 Age-related nuclear cataract, bilateral: Secondary | ICD-10-CM | POA: Diagnosis not present

## 2017-11-18 DIAGNOSIS — H524 Presbyopia: Secondary | ICD-10-CM | POA: Diagnosis not present

## 2018-01-27 ENCOUNTER — Ambulatory Visit (HOSPITAL_COMMUNITY)
Admission: EM | Admit: 2018-01-27 | Discharge: 2018-01-27 | Disposition: A | Discharge status: Home / Self Care | Payer: Medicare HMO | Attending: Internal Medicine | Admitting: Internal Medicine

## 2018-01-27 ENCOUNTER — Encounter (HOSPITAL_COMMUNITY): Payer: Self-pay | Admitting: Emergency Medicine

## 2018-01-27 DIAGNOSIS — M5441 Lumbago with sciatica, right side: Secondary | ICD-10-CM | POA: Diagnosis not present

## 2018-01-27 HISTORY — DX: Essential (primary) hypertension: I10

## 2018-01-27 HISTORY — DX: Pure hypercholesterolemia, unspecified: E78.00

## 2018-01-27 HISTORY — DX: Type 2 diabetes mellitus without complications: E11.9

## 2018-01-27 MED ORDER — PREDNISONE 10 MG (21) PO TBPK: ORAL_TABLET | Freq: Every day | ORAL | 0 refills | Status: DC

## 2018-01-27 NOTE — ED Triage Notes (Signed)
Pt c/o sciatic nerve flair up on R hip started this morning. Painful ambulation.

## 2018-01-27 NOTE — ED Provider Notes (Signed)
[facility]   [id number] 01/27/18 Arrival Time: 1406  SUBJECTIVE: History from: patient. Peter Aguilar is a 77 y.o. male complains of right sided low back pain that began this morning.  Denies a precipitating event or specific injury, but was cleaning horse stalls prior to symptoms.  Localizes the pain to the right low back.  Describes the pain as constant and sharp/ achy in character.  Has tried ibuprofen and tylenol without relief.  Symptoms are made worse with standing or laying down.  Reports similar symptoms in the past that improved with a prednisone dose pack.  Denies fever, chills, erythema, ecchymosis, effusion, weakness, numbness and tingling, saddle paresthesia, bowel or bladder incontinence.    ROS: As per HPI.  Past Medical History:  Diagnosis Date  . Diabetes mellitus without complication (HCC)   . Hypercholesteremia   . Hypertension    Past Surgical History:  Procedure Laterality Date  . CHOLECYSTECTOMY    . PARATHYROIDECTOMY     Allergies  Allergen Reactions  . Metformin And Related   . Tetracyclines & Related    No current facility-administered medications on file prior to encounter.    Current Outpatient Medications on File Prior to Encounter  Medication Sig Dispense Refill  . amLODipine (NORVASC) 5 MG tablet Take 5 mg by mouth daily.    Marland Kitchen aspirin 81 MG chewable tablet Chew by mouth daily.    . Atorvastatin Calcium (LIPITOR PO) Take by mouth.    . citalopram (CELEXA) 20 MG tablet Take 20 mg by mouth daily.    Marland Kitchen glimepiride (AMARYL) 2 MG tablet Take 2 mg by mouth daily with breakfast.    . levothyroxine (SYNTHROID, LEVOTHROID) 50 MCG tablet Take 50 mcg by mouth daily before breakfast.    . quinapril-hydrochlorothiazide (ACCURETIC) 20-12.5 MG tablet Take 1 tablet by mouth daily.     Social History   Socioeconomic History  . Marital status: Married    Spouse name: Not on file  . Number of children: Not on file  . Years of education: Not on  file  . Highest education level: Not on file  Occupational History  . Not on file  Social Needs  . Financial resource strain: Not on file  . Food insecurity:    Worry: Not on file    Inability: Not on file  . Transportation needs:    Medical: Not on file    Non-medical: Not on file  Tobacco Use  . Smoking status: Never Smoker  . Smokeless tobacco: Current User    Types: Chew  Substance and Sexual Activity  . Alcohol use: Yes  . Drug use: Not on file  . Sexual activity: Not on file  Lifestyle  . Physical activity:    Days per week: Not on file    Minutes per session: Not on file  . Stress: Not on file  Relationships  . Social connections:    Talks on phone: Not on file    Gets together: Not on file    Attends religious service: Not on file    Active member of club or organization: Not on file    Attends meetings of clubs or organizations: Not on file    Relationship status: Not on file  . Intimate partner violence:    Fear of current or ex partner: Not on file    Emotionally abused: Not on file    Physically abused: Not on file    Forced sexual activity: Not on file  Other  Topics Concern  . Not on file  Social History Narrative  . Not on file   No family history on file.  OBJECTIVE:  Vitals:   01/27/18 1433  BP: 123/71  Pulse: (!) 58  Resp: 18  Temp: 98 F (36.7 C)  TempSrc: Oral  SpO2: 98%     General appearance: AOx3; in no acute distress.  Head: NCAT Lungs: CTA bilaterally Heart: RRR.  Clear S1 and S2 without murmur, gallops, or rubs.  Radial pulses 2+ bilaterally 64 bpm Musculoskeletal: Lumbar spine Inspection: Skin warm, dry, clear and intact without obvious erythema, effusion, or ecchymosis.  Palpation: tender to palpation about the right low back ROM: FROM active and passive Strength: 5/5 shld abduction, 5/5 shld adduction, 5/5 elbow flexion, 5/5 elbow extension, 5/5 grip strength, 5/5 hip flexion, 5/5 knee abduction, 5/5 knee adduction, 5/5  knee flexion, 5/5 knee extension, 5/5 dorsiflexion, 5/5 plantar flexion Negative SLR Skin: warm and dry Neurologic: Sitting in wheelchair; Sensation intact about the upper/ lower extremities Psychological: alert and cooperative; normal mood and affect  ASSESSMENT & PLAN:  1. Acute right-sided low back pain with right-sided sciatica     Meds ordered this encounter  Medications  . predniSONE (STERAPRED UNI-PAK 21 TAB) 10 MG (21) TBPK tablet    Sig: Take by mouth daily. Take 6 tabs by mouth daily  for 2 days, then 5 tabs for 2 days, then 4 tabs for 2 days, then 3 tabs for 2 days, 2 tabs for 2 days, then 1 tab by mouth daily for 2 days    Dispense:  42 tablet    Refill:  0    Order Specific Question:   Supervising Provider    Answer:   Isa RankinMURRAY, LAURA WILSON [[id number]]    Continue conservative management of rest, ice, heat, and gentle stretches Prescribed a prednisone dose pak.  Take as directed and to completion Follow up with PCP if symptoms persist Return or go to the ER if you have any new or worsening symptoms (fever, chills, chest pain, abdominal pain, changes in bowel or bladder habits, pain radiating into lower legs, etc...)   Reviewed expectations re: course of current medical issues. Questions answered. Outlined signs and symptoms indicating need for more acute intervention. Patient verbalized understanding. After Visit Summary given.    Rennis HardingWurst, Ife Vitelli, PA-C 01/27/18 1502

## 2018-01-27 NOTE — Discharge Instructions (Addendum)
Continue conservative management of rest, ice, heat, and gentle stretches Prescribed a prednisone dose pak.  Take as directed and to completion Follow up with PCP if symptoms persist Return or go to the ER if you have any new or worsening symptoms (fever, chills, chest pain, abdominal pain, changes in bowel or bladder habits, pain radiating into lower legs, etc...)

## 2018-01-29 DIAGNOSIS — R69 Illness, unspecified: Secondary | ICD-10-CM | POA: Diagnosis not present

## 2018-02-10 DIAGNOSIS — M5116 Intervertebral disc disorders with radiculopathy, lumbar region: Secondary | ICD-10-CM | POA: Diagnosis not present

## 2018-02-10 DIAGNOSIS — M5431 Sciatica, right side: Secondary | ICD-10-CM | POA: Diagnosis not present

## 2018-02-10 DIAGNOSIS — M4807 Spinal stenosis, lumbosacral region: Secondary | ICD-10-CM | POA: Diagnosis not present

## 2018-02-16 DIAGNOSIS — M5416 Radiculopathy, lumbar region: Secondary | ICD-10-CM | POA: Diagnosis not present

## 2018-02-23 DIAGNOSIS — R69 Illness, unspecified: Secondary | ICD-10-CM | POA: Diagnosis not present

## 2018-02-25 ENCOUNTER — Other Ambulatory Visit: Payer: Self-pay | Admitting: Neurological Surgery

## 2018-02-25 DIAGNOSIS — M5416 Radiculopathy, lumbar region: Secondary | ICD-10-CM

## 2018-03-20 DIAGNOSIS — R69 Illness, unspecified: Secondary | ICD-10-CM | POA: Diagnosis not present

## 2018-04-14 DIAGNOSIS — R69 Illness, unspecified: Secondary | ICD-10-CM | POA: Diagnosis not present

## 2018-04-28 DIAGNOSIS — R69 Illness, unspecified: Secondary | ICD-10-CM | POA: Diagnosis not present

## 2018-09-07 DIAGNOSIS — E781 Pure hyperglyceridemia: Secondary | ICD-10-CM | POA: Diagnosis not present

## 2018-09-07 DIAGNOSIS — R69 Illness, unspecified: Secondary | ICD-10-CM | POA: Diagnosis not present

## 2018-09-07 DIAGNOSIS — E213 Hyperparathyroidism, unspecified: Secondary | ICD-10-CM | POA: Diagnosis not present

## 2018-09-07 DIAGNOSIS — M519 Unspecified thoracic, thoracolumbar and lumbosacral intervertebral disc disorder: Secondary | ICD-10-CM | POA: Diagnosis not present

## 2018-09-07 DIAGNOSIS — E782 Mixed hyperlipidemia: Secondary | ICD-10-CM | POA: Diagnosis not present

## 2018-09-07 DIAGNOSIS — E039 Hypothyroidism, unspecified: Secondary | ICD-10-CM | POA: Diagnosis not present

## 2018-09-07 DIAGNOSIS — E1122 Type 2 diabetes mellitus with diabetic chronic kidney disease: Secondary | ICD-10-CM | POA: Diagnosis not present

## 2018-09-07 DIAGNOSIS — N200 Calculus of kidney: Secondary | ICD-10-CM | POA: Diagnosis not present

## 2018-09-07 DIAGNOSIS — Z0000 Encounter for general adult medical examination without abnormal findings: Secondary | ICD-10-CM | POA: Diagnosis not present

## 2018-09-07 DIAGNOSIS — N183 Chronic kidney disease, stage 3 (moderate): Secondary | ICD-10-CM | POA: Diagnosis not present

## 2018-09-07 DIAGNOSIS — I10 Essential (primary) hypertension: Secondary | ICD-10-CM | POA: Diagnosis not present

## 2018-10-18 DIAGNOSIS — R69 Illness, unspecified: Secondary | ICD-10-CM | POA: Diagnosis not present

## 2018-11-10 DIAGNOSIS — R69 Illness, unspecified: Secondary | ICD-10-CM | POA: Diagnosis not present

## 2018-12-08 DIAGNOSIS — R69 Illness, unspecified: Secondary | ICD-10-CM | POA: Diagnosis not present

## 2019-01-12 DIAGNOSIS — R69 Illness, unspecified: Secondary | ICD-10-CM | POA: Diagnosis not present

## 2019-02-09 DIAGNOSIS — R69 Illness, unspecified: Secondary | ICD-10-CM | POA: Diagnosis not present

## 2019-03-14 DIAGNOSIS — E781 Pure hyperglyceridemia: Secondary | ICD-10-CM | POA: Diagnosis not present

## 2019-03-14 DIAGNOSIS — E039 Hypothyroidism, unspecified: Secondary | ICD-10-CM | POA: Diagnosis not present

## 2019-03-14 DIAGNOSIS — N183 Chronic kidney disease, stage 3 (moderate): Secondary | ICD-10-CM | POA: Diagnosis not present

## 2019-03-14 DIAGNOSIS — N182 Chronic kidney disease, stage 2 (mild): Secondary | ICD-10-CM | POA: Diagnosis not present

## 2019-03-14 DIAGNOSIS — N400 Enlarged prostate without lower urinary tract symptoms: Secondary | ICD-10-CM | POA: Diagnosis not present

## 2019-03-14 DIAGNOSIS — I10 Essential (primary) hypertension: Secondary | ICD-10-CM | POA: Diagnosis not present

## 2019-03-14 DIAGNOSIS — R69 Illness, unspecified: Secondary | ICD-10-CM | POA: Diagnosis not present

## 2019-03-14 DIAGNOSIS — E782 Mixed hyperlipidemia: Secondary | ICD-10-CM | POA: Diagnosis not present

## 2019-03-14 DIAGNOSIS — E7800 Pure hypercholesterolemia, unspecified: Secondary | ICD-10-CM | POA: Diagnosis not present

## 2019-03-14 DIAGNOSIS — E1122 Type 2 diabetes mellitus with diabetic chronic kidney disease: Secondary | ICD-10-CM | POA: Diagnosis not present

## 2019-03-16 DIAGNOSIS — R69 Illness, unspecified: Secondary | ICD-10-CM | POA: Diagnosis not present

## 2019-04-06 DIAGNOSIS — M545 Low back pain: Secondary | ICD-10-CM | POA: Diagnosis not present

## 2019-04-06 DIAGNOSIS — M5416 Radiculopathy, lumbar region: Secondary | ICD-10-CM | POA: Diagnosis not present

## 2019-04-07 DIAGNOSIS — I10 Essential (primary) hypertension: Secondary | ICD-10-CM | POA: Diagnosis not present

## 2019-04-07 DIAGNOSIS — M5126 Other intervertebral disc displacement, lumbar region: Secondary | ICD-10-CM | POA: Diagnosis not present

## 2019-04-07 DIAGNOSIS — Z6825 Body mass index (BMI) 25.0-25.9, adult: Secondary | ICD-10-CM | POA: Diagnosis not present

## 2019-04-08 DIAGNOSIS — Z1159 Encounter for screening for other viral diseases: Secondary | ICD-10-CM | POA: Diagnosis not present

## 2019-04-14 DIAGNOSIS — M48061 Spinal stenosis, lumbar region without neurogenic claudication: Secondary | ICD-10-CM | POA: Diagnosis not present

## 2019-04-14 DIAGNOSIS — M5126 Other intervertebral disc displacement, lumbar region: Secondary | ICD-10-CM | POA: Diagnosis not present

## 2019-04-14 DIAGNOSIS — M5416 Radiculopathy, lumbar region: Secondary | ICD-10-CM | POA: Diagnosis not present

## 2019-04-14 HISTORY — PX: BACK SURGERY: SHX140

## 2019-04-22 DIAGNOSIS — R69 Illness, unspecified: Secondary | ICD-10-CM | POA: Diagnosis not present

## 2019-05-01 DIAGNOSIS — R69 Illness, unspecified: Secondary | ICD-10-CM | POA: Diagnosis not present

## 2019-05-04 DIAGNOSIS — N1831 Chronic kidney disease, stage 3a: Secondary | ICD-10-CM | POA: Diagnosis not present

## 2019-05-04 DIAGNOSIS — E1122 Type 2 diabetes mellitus with diabetic chronic kidney disease: Secondary | ICD-10-CM | POA: Diagnosis not present

## 2019-05-04 DIAGNOSIS — N182 Chronic kidney disease, stage 2 (mild): Secondary | ICD-10-CM | POA: Diagnosis not present

## 2019-05-04 DIAGNOSIS — R69 Illness, unspecified: Secondary | ICD-10-CM | POA: Diagnosis not present

## 2019-05-04 DIAGNOSIS — I10 Essential (primary) hypertension: Secondary | ICD-10-CM | POA: Diagnosis not present

## 2019-05-04 DIAGNOSIS — N400 Enlarged prostate without lower urinary tract symptoms: Secondary | ICD-10-CM | POA: Diagnosis not present

## 2019-05-04 DIAGNOSIS — E039 Hypothyroidism, unspecified: Secondary | ICD-10-CM | POA: Diagnosis not present

## 2019-05-04 DIAGNOSIS — E782 Mixed hyperlipidemia: Secondary | ICD-10-CM | POA: Diagnosis not present

## 2019-05-04 DIAGNOSIS — E781 Pure hyperglyceridemia: Secondary | ICD-10-CM | POA: Diagnosis not present

## 2019-05-04 DIAGNOSIS — E7800 Pure hypercholesterolemia, unspecified: Secondary | ICD-10-CM | POA: Diagnosis not present

## 2019-05-21 DIAGNOSIS — R69 Illness, unspecified: Secondary | ICD-10-CM | POA: Diagnosis not present

## 2019-05-23 DIAGNOSIS — R42 Dizziness and giddiness: Secondary | ICD-10-CM | POA: Diagnosis not present

## 2019-05-23 DIAGNOSIS — E1165 Type 2 diabetes mellitus with hyperglycemia: Secondary | ICD-10-CM | POA: Diagnosis not present

## 2019-06-16 DIAGNOSIS — R69 Illness, unspecified: Secondary | ICD-10-CM | POA: Diagnosis not present

## 2019-07-29 DIAGNOSIS — N182 Chronic kidney disease, stage 2 (mild): Secondary | ICD-10-CM | POA: Diagnosis not present

## 2019-07-29 DIAGNOSIS — N400 Enlarged prostate without lower urinary tract symptoms: Secondary | ICD-10-CM | POA: Diagnosis not present

## 2019-07-29 DIAGNOSIS — E1122 Type 2 diabetes mellitus with diabetic chronic kidney disease: Secondary | ICD-10-CM | POA: Diagnosis not present

## 2019-07-29 DIAGNOSIS — E039 Hypothyroidism, unspecified: Secondary | ICD-10-CM | POA: Diagnosis not present

## 2019-07-29 DIAGNOSIS — E7800 Pure hypercholesterolemia, unspecified: Secondary | ICD-10-CM | POA: Diagnosis not present

## 2019-07-29 DIAGNOSIS — E782 Mixed hyperlipidemia: Secondary | ICD-10-CM | POA: Diagnosis not present

## 2019-07-29 DIAGNOSIS — E781 Pure hyperglyceridemia: Secondary | ICD-10-CM | POA: Diagnosis not present

## 2019-07-29 DIAGNOSIS — I10 Essential (primary) hypertension: Secondary | ICD-10-CM | POA: Diagnosis not present

## 2019-07-29 DIAGNOSIS — N1831 Chronic kidney disease, stage 3a: Secondary | ICD-10-CM | POA: Diagnosis not present

## 2019-07-29 DIAGNOSIS — R69 Illness, unspecified: Secondary | ICD-10-CM | POA: Diagnosis not present

## 2019-08-19 DIAGNOSIS — R42 Dizziness and giddiness: Secondary | ICD-10-CM | POA: Diagnosis not present

## 2019-08-22 DIAGNOSIS — R42 Dizziness and giddiness: Secondary | ICD-10-CM | POA: Diagnosis not present

## 2019-08-23 ENCOUNTER — Other Ambulatory Visit: Payer: Self-pay | Admitting: Internal Medicine

## 2019-08-23 DIAGNOSIS — R42 Dizziness and giddiness: Secondary | ICD-10-CM

## 2019-08-29 DIAGNOSIS — R42 Dizziness and giddiness: Secondary | ICD-10-CM | POA: Diagnosis not present

## 2019-09-08 DIAGNOSIS — E1122 Type 2 diabetes mellitus with diabetic chronic kidney disease: Secondary | ICD-10-CM | POA: Diagnosis not present

## 2019-09-08 DIAGNOSIS — E781 Pure hyperglyceridemia: Secondary | ICD-10-CM | POA: Diagnosis not present

## 2019-09-08 DIAGNOSIS — E782 Mixed hyperlipidemia: Secondary | ICD-10-CM | POA: Diagnosis not present

## 2019-09-08 DIAGNOSIS — E1165 Type 2 diabetes mellitus with hyperglycemia: Secondary | ICD-10-CM | POA: Diagnosis not present

## 2019-09-08 DIAGNOSIS — N182 Chronic kidney disease, stage 2 (mild): Secondary | ICD-10-CM | POA: Diagnosis not present

## 2019-09-08 DIAGNOSIS — I10 Essential (primary) hypertension: Secondary | ICD-10-CM | POA: Diagnosis not present

## 2019-09-08 DIAGNOSIS — E7800 Pure hypercholesterolemia, unspecified: Secondary | ICD-10-CM | POA: Diagnosis not present

## 2019-09-08 DIAGNOSIS — E039 Hypothyroidism, unspecified: Secondary | ICD-10-CM | POA: Diagnosis not present

## 2019-09-08 DIAGNOSIS — N400 Enlarged prostate without lower urinary tract symptoms: Secondary | ICD-10-CM | POA: Diagnosis not present

## 2019-09-08 DIAGNOSIS — R69 Illness, unspecified: Secondary | ICD-10-CM | POA: Diagnosis not present

## 2019-09-13 DIAGNOSIS — R69 Illness, unspecified: Secondary | ICD-10-CM | POA: Diagnosis not present

## 2019-09-13 DIAGNOSIS — Z1389 Encounter for screening for other disorder: Secondary | ICD-10-CM | POA: Diagnosis not present

## 2019-09-13 DIAGNOSIS — N182 Chronic kidney disease, stage 2 (mild): Secondary | ICD-10-CM | POA: Diagnosis not present

## 2019-09-13 DIAGNOSIS — R42 Dizziness and giddiness: Secondary | ICD-10-CM | POA: Diagnosis not present

## 2019-09-13 DIAGNOSIS — E782 Mixed hyperlipidemia: Secondary | ICD-10-CM | POA: Diagnosis not present

## 2019-09-13 DIAGNOSIS — I10 Essential (primary) hypertension: Secondary | ICD-10-CM | POA: Diagnosis not present

## 2019-09-13 DIAGNOSIS — N400 Enlarged prostate without lower urinary tract symptoms: Secondary | ICD-10-CM | POA: Diagnosis not present

## 2019-09-13 DIAGNOSIS — Z0000 Encounter for general adult medical examination without abnormal findings: Secondary | ICD-10-CM | POA: Diagnosis not present

## 2019-09-13 DIAGNOSIS — I700 Atherosclerosis of aorta: Secondary | ICD-10-CM | POA: Diagnosis not present

## 2019-09-13 DIAGNOSIS — E1122 Type 2 diabetes mellitus with diabetic chronic kidney disease: Secondary | ICD-10-CM | POA: Diagnosis not present

## 2019-09-19 ENCOUNTER — Other Ambulatory Visit: Payer: Medicare HMO

## 2019-10-04 DIAGNOSIS — E781 Pure hyperglyceridemia: Secondary | ICD-10-CM | POA: Diagnosis not present

## 2019-10-04 DIAGNOSIS — N400 Enlarged prostate without lower urinary tract symptoms: Secondary | ICD-10-CM | POA: Diagnosis not present

## 2019-10-04 DIAGNOSIS — E1122 Type 2 diabetes mellitus with diabetic chronic kidney disease: Secondary | ICD-10-CM | POA: Diagnosis not present

## 2019-10-04 DIAGNOSIS — I10 Essential (primary) hypertension: Secondary | ICD-10-CM | POA: Diagnosis not present

## 2019-10-04 DIAGNOSIS — E1165 Type 2 diabetes mellitus with hyperglycemia: Secondary | ICD-10-CM | POA: Diagnosis not present

## 2019-10-04 DIAGNOSIS — E7800 Pure hypercholesterolemia, unspecified: Secondary | ICD-10-CM | POA: Diagnosis not present

## 2019-10-04 DIAGNOSIS — R69 Illness, unspecified: Secondary | ICD-10-CM | POA: Diagnosis not present

## 2019-10-04 DIAGNOSIS — E039 Hypothyroidism, unspecified: Secondary | ICD-10-CM | POA: Diagnosis not present

## 2019-10-04 DIAGNOSIS — E782 Mixed hyperlipidemia: Secondary | ICD-10-CM | POA: Diagnosis not present

## 2019-10-04 DIAGNOSIS — N182 Chronic kidney disease, stage 2 (mild): Secondary | ICD-10-CM | POA: Diagnosis not present

## 2019-10-06 ENCOUNTER — Other Ambulatory Visit: Payer: Self-pay

## 2019-10-06 ENCOUNTER — Ambulatory Visit
Admission: RE | Admit: 2019-10-06 | Discharge: 2019-10-06 | Disposition: A | Discharge status: Home / Self Care | Payer: Medicare HMO | Source: Ambulatory Visit | Attending: Internal Medicine | Admitting: Internal Medicine

## 2019-10-06 DIAGNOSIS — R42 Dizziness and giddiness: Secondary | ICD-10-CM

## 2019-10-06 MED ORDER — GADOBENATE DIMEGLUMINE 529 MG/ML IV SOLN
17.0000 mL | Freq: Once | INTRAVENOUS | Status: AC | PRN
  Administered 2019-10-06: 17 mL via INTRAVENOUS

## 2019-10-17 DIAGNOSIS — E782 Mixed hyperlipidemia: Secondary | ICD-10-CM | POA: Diagnosis not present

## 2019-12-07 DIAGNOSIS — H5203 Hypermetropia, bilateral: Secondary | ICD-10-CM | POA: Diagnosis not present

## 2019-12-07 DIAGNOSIS — E11319 Type 2 diabetes mellitus with unspecified diabetic retinopathy without macular edema: Secondary | ICD-10-CM | POA: Diagnosis not present

## 2019-12-07 DIAGNOSIS — Z7984 Long term (current) use of oral hypoglycemic drugs: Secondary | ICD-10-CM | POA: Diagnosis not present

## 2019-12-07 DIAGNOSIS — H52221 Regular astigmatism, right eye: Secondary | ICD-10-CM | POA: Diagnosis not present

## 2019-12-07 DIAGNOSIS — E119 Type 2 diabetes mellitus without complications: Secondary | ICD-10-CM | POA: Diagnosis not present

## 2019-12-07 DIAGNOSIS — H25813 Combined forms of age-related cataract, bilateral: Secondary | ICD-10-CM | POA: Diagnosis not present

## 2019-12-07 DIAGNOSIS — H2513 Age-related nuclear cataract, bilateral: Secondary | ICD-10-CM | POA: Diagnosis not present

## 2019-12-07 DIAGNOSIS — H524 Presbyopia: Secondary | ICD-10-CM | POA: Diagnosis not present

## 2019-12-08 DIAGNOSIS — N182 Chronic kidney disease, stage 2 (mild): Secondary | ICD-10-CM | POA: Diagnosis not present

## 2019-12-08 DIAGNOSIS — E7800 Pure hypercholesterolemia, unspecified: Secondary | ICD-10-CM | POA: Diagnosis not present

## 2019-12-08 DIAGNOSIS — I10 Essential (primary) hypertension: Secondary | ICD-10-CM | POA: Diagnosis not present

## 2019-12-08 DIAGNOSIS — R69 Illness, unspecified: Secondary | ICD-10-CM | POA: Diagnosis not present

## 2019-12-08 DIAGNOSIS — N400 Enlarged prostate without lower urinary tract symptoms: Secondary | ICD-10-CM | POA: Diagnosis not present

## 2019-12-08 DIAGNOSIS — N183 Chronic kidney disease, stage 3 unspecified: Secondary | ICD-10-CM | POA: Diagnosis not present

## 2019-12-08 DIAGNOSIS — E782 Mixed hyperlipidemia: Secondary | ICD-10-CM | POA: Diagnosis not present

## 2019-12-08 DIAGNOSIS — E1122 Type 2 diabetes mellitus with diabetic chronic kidney disease: Secondary | ICD-10-CM | POA: Diagnosis not present

## 2019-12-08 DIAGNOSIS — E781 Pure hyperglyceridemia: Secondary | ICD-10-CM | POA: Diagnosis not present

## 2019-12-08 DIAGNOSIS — E039 Hypothyroidism, unspecified: Secondary | ICD-10-CM | POA: Diagnosis not present

## 2020-02-27 DIAGNOSIS — R69 Illness, unspecified: Secondary | ICD-10-CM | POA: Diagnosis not present

## 2020-02-27 DIAGNOSIS — E1122 Type 2 diabetes mellitus with diabetic chronic kidney disease: Secondary | ICD-10-CM | POA: Diagnosis not present

## 2020-02-27 DIAGNOSIS — E1165 Type 2 diabetes mellitus with hyperglycemia: Secondary | ICD-10-CM | POA: Diagnosis not present

## 2020-02-27 DIAGNOSIS — E039 Hypothyroidism, unspecified: Secondary | ICD-10-CM | POA: Diagnosis not present

## 2020-02-27 DIAGNOSIS — E7800 Pure hypercholesterolemia, unspecified: Secondary | ICD-10-CM | POA: Diagnosis not present

## 2020-02-27 DIAGNOSIS — N182 Chronic kidney disease, stage 2 (mild): Secondary | ICD-10-CM | POA: Diagnosis not present

## 2020-02-27 DIAGNOSIS — E781 Pure hyperglyceridemia: Secondary | ICD-10-CM | POA: Diagnosis not present

## 2020-02-27 DIAGNOSIS — I10 Essential (primary) hypertension: Secondary | ICD-10-CM | POA: Diagnosis not present

## 2020-02-27 DIAGNOSIS — N400 Enlarged prostate without lower urinary tract symptoms: Secondary | ICD-10-CM | POA: Diagnosis not present

## 2020-02-27 DIAGNOSIS — E782 Mixed hyperlipidemia: Secondary | ICD-10-CM | POA: Diagnosis not present

## 2020-04-03 DIAGNOSIS — R351 Nocturia: Secondary | ICD-10-CM | POA: Diagnosis not present

## 2020-04-03 DIAGNOSIS — R5383 Other fatigue: Secondary | ICD-10-CM | POA: Diagnosis not present

## 2020-04-05 DIAGNOSIS — E039 Hypothyroidism, unspecified: Secondary | ICD-10-CM | POA: Diagnosis not present

## 2020-04-05 DIAGNOSIS — E7800 Pure hypercholesterolemia, unspecified: Secondary | ICD-10-CM | POA: Diagnosis not present

## 2020-04-05 DIAGNOSIS — E781 Pure hyperglyceridemia: Secondary | ICD-10-CM | POA: Diagnosis not present

## 2020-04-05 DIAGNOSIS — N400 Enlarged prostate without lower urinary tract symptoms: Secondary | ICD-10-CM | POA: Diagnosis not present

## 2020-04-05 DIAGNOSIS — I10 Essential (primary) hypertension: Secondary | ICD-10-CM | POA: Diagnosis not present

## 2020-04-05 DIAGNOSIS — N182 Chronic kidney disease, stage 2 (mild): Secondary | ICD-10-CM | POA: Diagnosis not present

## 2020-04-05 DIAGNOSIS — E1122 Type 2 diabetes mellitus with diabetic chronic kidney disease: Secondary | ICD-10-CM | POA: Diagnosis not present

## 2020-04-05 DIAGNOSIS — E1165 Type 2 diabetes mellitus with hyperglycemia: Secondary | ICD-10-CM | POA: Diagnosis not present

## 2020-04-05 DIAGNOSIS — R69 Illness, unspecified: Secondary | ICD-10-CM | POA: Diagnosis not present

## 2020-04-05 DIAGNOSIS — E782 Mixed hyperlipidemia: Secondary | ICD-10-CM | POA: Diagnosis not present

## 2020-04-06 DIAGNOSIS — E782 Mixed hyperlipidemia: Secondary | ICD-10-CM | POA: Diagnosis not present

## 2020-04-06 DIAGNOSIS — N390 Urinary tract infection, site not specified: Secondary | ICD-10-CM | POA: Diagnosis not present

## 2020-04-06 DIAGNOSIS — N183 Chronic kidney disease, stage 3 unspecified: Secondary | ICD-10-CM | POA: Diagnosis not present

## 2020-04-06 DIAGNOSIS — I700 Atherosclerosis of aorta: Secondary | ICD-10-CM | POA: Diagnosis not present

## 2020-04-06 DIAGNOSIS — N400 Enlarged prostate without lower urinary tract symptoms: Secondary | ICD-10-CM | POA: Diagnosis not present

## 2020-04-06 DIAGNOSIS — E1122 Type 2 diabetes mellitus with diabetic chronic kidney disease: Secondary | ICD-10-CM | POA: Diagnosis not present

## 2020-04-06 DIAGNOSIS — I10 Essential (primary) hypertension: Secondary | ICD-10-CM | POA: Diagnosis not present

## 2020-04-23 DIAGNOSIS — R69 Illness, unspecified: Secondary | ICD-10-CM | POA: Diagnosis not present

## 2020-04-25 DIAGNOSIS — E7800 Pure hypercholesterolemia, unspecified: Secondary | ICD-10-CM | POA: Diagnosis not present

## 2020-04-25 DIAGNOSIS — E039 Hypothyroidism, unspecified: Secondary | ICD-10-CM | POA: Diagnosis not present

## 2020-04-25 DIAGNOSIS — E781 Pure hyperglyceridemia: Secondary | ICD-10-CM | POA: Diagnosis not present

## 2020-04-25 DIAGNOSIS — N400 Enlarged prostate without lower urinary tract symptoms: Secondary | ICD-10-CM | POA: Diagnosis not present

## 2020-04-25 DIAGNOSIS — E1165 Type 2 diabetes mellitus with hyperglycemia: Secondary | ICD-10-CM | POA: Diagnosis not present

## 2020-04-25 DIAGNOSIS — N182 Chronic kidney disease, stage 2 (mild): Secondary | ICD-10-CM | POA: Diagnosis not present

## 2020-04-25 DIAGNOSIS — R69 Illness, unspecified: Secondary | ICD-10-CM | POA: Diagnosis not present

## 2020-04-25 DIAGNOSIS — I10 Essential (primary) hypertension: Secondary | ICD-10-CM | POA: Diagnosis not present

## 2020-04-25 DIAGNOSIS — E782 Mixed hyperlipidemia: Secondary | ICD-10-CM | POA: Diagnosis not present

## 2020-04-25 DIAGNOSIS — E1122 Type 2 diabetes mellitus with diabetic chronic kidney disease: Secondary | ICD-10-CM | POA: Diagnosis not present

## 2020-05-01 DIAGNOSIS — R69 Illness, unspecified: Secondary | ICD-10-CM | POA: Diagnosis not present

## 2020-05-25 DIAGNOSIS — R69 Illness, unspecified: Secondary | ICD-10-CM | POA: Diagnosis not present

## 2020-06-27 DIAGNOSIS — R69 Illness, unspecified: Secondary | ICD-10-CM | POA: Diagnosis not present

## 2020-06-29 DIAGNOSIS — I10 Essential (primary) hypertension: Secondary | ICD-10-CM | POA: Diagnosis not present

## 2020-06-29 DIAGNOSIS — E782 Mixed hyperlipidemia: Secondary | ICD-10-CM | POA: Diagnosis not present

## 2020-06-29 DIAGNOSIS — E1122 Type 2 diabetes mellitus with diabetic chronic kidney disease: Secondary | ICD-10-CM | POA: Diagnosis not present

## 2020-06-29 DIAGNOSIS — E7800 Pure hypercholesterolemia, unspecified: Secondary | ICD-10-CM | POA: Diagnosis not present

## 2020-06-29 DIAGNOSIS — R69 Illness, unspecified: Secondary | ICD-10-CM | POA: Diagnosis not present

## 2020-06-29 DIAGNOSIS — N400 Enlarged prostate without lower urinary tract symptoms: Secondary | ICD-10-CM | POA: Diagnosis not present

## 2020-06-29 DIAGNOSIS — E1165 Type 2 diabetes mellitus with hyperglycemia: Secondary | ICD-10-CM | POA: Diagnosis not present

## 2020-06-29 DIAGNOSIS — E039 Hypothyroidism, unspecified: Secondary | ICD-10-CM | POA: Diagnosis not present

## 2020-06-29 DIAGNOSIS — E781 Pure hyperglyceridemia: Secondary | ICD-10-CM | POA: Diagnosis not present

## 2020-06-29 DIAGNOSIS — N182 Chronic kidney disease, stage 2 (mild): Secondary | ICD-10-CM | POA: Diagnosis not present

## 2020-07-26 DIAGNOSIS — I10 Essential (primary) hypertension: Secondary | ICD-10-CM | POA: Diagnosis not present

## 2020-07-26 DIAGNOSIS — M5416 Radiculopathy, lumbar region: Secondary | ICD-10-CM | POA: Diagnosis not present

## 2020-07-26 DIAGNOSIS — Z6825 Body mass index (BMI) 25.0-25.9, adult: Secondary | ICD-10-CM | POA: Diagnosis not present

## 2020-08-14 DIAGNOSIS — M545 Low back pain, unspecified: Secondary | ICD-10-CM | POA: Diagnosis not present

## 2020-08-14 DIAGNOSIS — M5416 Radiculopathy, lumbar region: Secondary | ICD-10-CM | POA: Diagnosis not present

## 2020-08-16 DIAGNOSIS — M5416 Radiculopathy, lumbar region: Secondary | ICD-10-CM | POA: Diagnosis not present

## 2020-08-17 ENCOUNTER — Other Ambulatory Visit: Payer: Self-pay | Admitting: Neurological Surgery

## 2020-08-17 DIAGNOSIS — M48061 Spinal stenosis, lumbar region without neurogenic claudication: Secondary | ICD-10-CM

## 2020-09-03 NOTE — Pre-Procedure Instructions (Signed)
Surgical Instructions    Your procedure is scheduled on Monday, February 28th.  Report to [facility] Main Entrance "A" at 10:00 A.M., then check in with the Admitting office.  Call this number if you have problems the morning of surgery:  [phone]   If you have any questions prior to your surgery date call [phone]: Open Monday-Friday 8am-4pm    Remember:  Do not eat or drink after midnight the night before your surgery    Take these medicines the morning of surgery with A SIP OF WATER  amLODipine (NORVASC) levothyroxine (SYNTHROID)   As of today, STOP taking any Aspirin (unless otherwise instructed by your surgeon) Aleve, Naproxen, Ibuprofen, Motrin, Advil, Goody's, BC's, all herbal medications, fish oil, and all vitamins.   WHAT DO I DO ABOUT MY DIABETES MEDICATION?   Marland Kitchen Do not take JANUVIA  Or glimepiride (AMARYL) on the morning of surgery.   . THE NIGHT BEFORE SURGERY, do NOT take glimepiride (AMARYL).   HOW TO MANAGE YOUR DIABETES BEFORE AND AFTER SURGERY  Why is it important to control my blood sugar before and after surgery? . Improving blood sugar levels before and after surgery helps healing and can limit problems. . A way of improving blood sugar control is eating a healthy diet by: o  Eating less sugar and carbohydrates o  Increasing activity/exercise o  Talking with your doctor about reaching your blood sugar goals . High blood sugars (greater than 180 mg/dL) can raise your risk of infections and slow your recovery, so you will need to focus on controlling your diabetes during the weeks before surgery. . Make sure that the doctor who takes care of your diabetes knows about your planned surgery including the date and location.  How do I manage my blood sugar before surgery? . Check your blood sugar at least 4 times a day, starting 2 days before surgery, to make sure that the level is not too high or low. . Check your blood sugar the morning of your surgery  when you wake up and every 2 hours until you get to the Short Stay unit. o If your blood sugar is less than 70 mg/dL, you will need to treat for low blood sugar: - Do not take insulin. - Treat a low blood sugar (less than 70 mg/dL) with  cup of clear juice (cranberry or apple), 4 glucose tablets, OR glucose gel. - Recheck blood sugar in 15 minutes after treatment (to make sure it is greater than 70 mg/dL). If your blood sugar is not greater than 70 mg/dL on recheck, call [phone] for further instructions. . Report your blood sugar to the short stay nurse when you get to Short Stay.  . If you are admitted to the hospital after surgery: o Your blood sugar will be checked by the staff and you will probably be given insulin after surgery (instead of oral diabetes medicines) to make sure you have good blood sugar levels. o The goal for blood sugar control after surgery is 80-180 mg/dL.                      Do not wear jewelry.            Do not wear lotions, powders, colognes, or deodorant.            Men may shave face and neck.            Do not bring valuables to the hospital.  [facility] is not responsible for any belongings or valuables.  Do NOT Smoke (Tobacco/Vaping) or drink Alcohol 24 hours prior to your procedure If you use a CPAP at night, you may bring all equipment for your overnight stay.   Contacts, glasses, dentures or bridgework may not be worn into surgery, please bring cases for these belongings   For patients admitted to the hospital, discharge time will be determined by your treatment team.   Patients discharged the day of surgery will not be allowed to drive home, and someone needs to stay with them for 24 hours.    Special instructions:   [place]- Preparing For Surgery  Before surgery, you can play an important role. Because skin is not sterile, your skin needs to be as free of germs as possible. You can reduce the number of germs on your skin by  washing with CHG (chlorahexidine gluconate) Soap before surgery.  CHG is an antiseptic cleaner which kills germs and bonds with the skin to continue killing germs even after washing.    Oral Hygiene is also important to reduce your risk of infection.  Remember - BRUSH YOUR TEETH THE MORNING OF SURGERY WITH YOUR REGULAR TOOTHPASTE  Please do not use if you have an allergy to CHG or antibacterial soaps. If your skin becomes reddened/irritated stop using the CHG.  Do not shave (including legs and underarms) for at least 48 hours prior to first CHG shower. It is OK to shave your face.  Please follow these instructions carefully.   1. Shower the NIGHT BEFORE SURGERY and the MORNING OF SURGERY  2. If you chose to wash your hair, wash your hair first as usual with your normal shampoo.  3. After you shampoo, rinse your hair and body thoroughly to remove the shampoo.  4. Use CHG Soap as you would any other liquid soap. You can apply CHG directly to the skin and wash gently with a scrungie or a clean washcloth.   5. Apply the CHG Soap to your body ONLY FROM THE NECK DOWN.  Do not use on open wounds or open sores. Avoid contact with your eyes, ears, mouth and genitals (private parts). Wash Face and genitals (private parts)  with your normal soap.   6. Wash thoroughly, paying special attention to the area where your surgery will be performed.  7. Thoroughly rinse your body with warm water from the neck down.  8. DO NOT shower/wash with your normal soap after using and rinsing off the CHG Soap.  9. Pat yourself dry with a CLEAN TOWEL.  10. Wear CLEAN PAJAMAS to bed the night before surgery  11. Place CLEAN SHEETS on your bed the night before your surgery  12. DO NOT SLEEP WITH PETS.   Day of Surgery: Wear Clean/Comfortable clothing the morning of surgery Do not apply any deodorants/lotions.   Remember to brush your teeth WITH YOUR REGULAR TOOTHPASTE.   Please read over the following fact  sheets that you were given.

## 2020-09-04 ENCOUNTER — Encounter (HOSPITAL_COMMUNITY)
Admission: RE | Admit: 2020-09-04 | Discharge: 2020-09-04 | Disposition: A | Discharge status: Home / Self Care | Payer: Medicare HMO | Source: Ambulatory Visit | Attending: Neurological Surgery | Admitting: Neurological Surgery

## 2020-09-04 ENCOUNTER — Encounter (HOSPITAL_COMMUNITY): Payer: Self-pay

## 2020-09-04 ENCOUNTER — Other Ambulatory Visit: Payer: Self-pay

## 2020-09-04 DIAGNOSIS — N400 Enlarged prostate without lower urinary tract symptoms: Secondary | ICD-10-CM | POA: Diagnosis not present

## 2020-09-04 DIAGNOSIS — Z01818 Encounter for other preprocedural examination: Secondary | ICD-10-CM | POA: Insufficient documentation

## 2020-09-04 DIAGNOSIS — E7800 Pure hypercholesterolemia, unspecified: Secondary | ICD-10-CM | POA: Diagnosis not present

## 2020-09-04 DIAGNOSIS — M48061 Spinal stenosis, lumbar region without neurogenic claudication: Secondary | ICD-10-CM | POA: Insufficient documentation

## 2020-09-04 DIAGNOSIS — N183 Chronic kidney disease, stage 3 unspecified: Secondary | ICD-10-CM | POA: Diagnosis not present

## 2020-09-04 DIAGNOSIS — R69 Illness, unspecified: Secondary | ICD-10-CM | POA: Diagnosis not present

## 2020-09-04 DIAGNOSIS — R918 Other nonspecific abnormal finding of lung field: Secondary | ICD-10-CM | POA: Diagnosis not present

## 2020-09-04 DIAGNOSIS — E039 Hypothyroidism, unspecified: Secondary | ICD-10-CM | POA: Diagnosis not present

## 2020-09-04 DIAGNOSIS — I10 Essential (primary) hypertension: Secondary | ICD-10-CM | POA: Diagnosis not present

## 2020-09-04 DIAGNOSIS — E782 Mixed hyperlipidemia: Secondary | ICD-10-CM | POA: Diagnosis not present

## 2020-09-04 DIAGNOSIS — E1165 Type 2 diabetes mellitus with hyperglycemia: Secondary | ICD-10-CM | POA: Diagnosis not present

## 2020-09-04 DIAGNOSIS — E781 Pure hyperglyceridemia: Secondary | ICD-10-CM | POA: Diagnosis not present

## 2020-09-04 DIAGNOSIS — E1122 Type 2 diabetes mellitus with diabetic chronic kidney disease: Secondary | ICD-10-CM | POA: Diagnosis not present

## 2020-09-04 DIAGNOSIS — M47814 Spondylosis without myelopathy or radiculopathy, thoracic region: Secondary | ICD-10-CM | POA: Diagnosis not present

## 2020-09-04 HISTORY — DX: Gilbert syndrome: E80.4

## 2020-09-04 HISTORY — DX: Other reduction deformities of brain: Q04.3

## 2020-09-04 HISTORY — DX: Personal history of urinary calculi: Z87.442

## 2020-09-04 HISTORY — DX: Hypothyroidism, unspecified: E03.9

## 2020-09-04 HISTORY — DX: Benign prostatic hyperplasia without lower urinary tract symptoms: N40.0

## 2020-09-04 LAB — BASIC METABOLIC PANEL
Anion gap: 11 (ref 5–15)
BUN: 27 mg/dL — ABNORMAL HIGH (ref 8–23)
CO2: 29 mmol/L (ref 22–32)
Calcium: 9.7 mg/dL (ref 8.9–10.3)
Chloride: 102 mmol/L (ref 98–111)
Creatinine, Ser: 1.14 mg/dL (ref 0.61–1.24)
GFR, Estimated: >60 mL/min (ref >60)
Glucose, Bld: 140 mg/dL — ABNORMAL HIGH (ref 70–99)
Potassium: 3.4 mmol/L — ABNORMAL LOW (ref 3.5–5.1)
Sodium: 142 mmol/L (ref 135–145)

## 2020-09-04 LAB — CBC WITH DIFFERENTIAL/PLATELET
Abs Immature Granulocytes: 0.05 10*3/uL (ref 0.00–0.07)
Basophils Absolute: 0 10*3/uL (ref 0.0–0.1)
Basophils Relative: 1 %
Eosinophils Absolute: 0.2 10*3/uL (ref 0.0–0.5)
Eosinophils Relative: 2 %
HCT: 42.8 % (ref 39.0–52.0)
Hemoglobin: 15.7 g/dL (ref 13.0–17.0)
Immature Granulocytes: 1 %
Lymphocytes Relative: 33 %
Lymphs Abs: 2.6 10*3/uL (ref 0.7–4.0)
MCH: 32.1 pg (ref 26.0–34.0)
MCHC: 36.7 g/dL — ABNORMAL HIGH (ref 30.0–36.0)
MCV: 87.5 fL (ref 80.0–100.0)
Monocytes Absolute: 0.6 10*3/uL (ref 0.1–1.0)
Monocytes Relative: 8 %
Neutro Abs: 4.4 10*3/uL (ref 1.7–7.7)
Neutrophils Relative %: 55 %
Platelets: 205 10*3/uL (ref 150–400)
RBC: 4.89 MIL/uL (ref 4.22–5.81)
RDW: 13.2 % (ref 11.5–15.5)
WBC: 7.9 10*3/uL (ref 4.0–10.5)
nRBC: 0 % (ref 0.0–0.2)

## 2020-09-04 LAB — HEMOGLOBIN A1C
Hgb A1c MFr Bld: 6 % — ABNORMAL HIGH (ref 4.8–5.6)
Mean Plasma Glucose: 125.5 mg/dL

## 2020-09-04 LAB — SURGICAL PCR SCREEN
MRSA, PCR: NEGATIVE
Staphylococcus aureus: NEGATIVE

## 2020-09-04 LAB — PROTIME-INR
INR: 1 (ref 0.8–1.2)
Prothrombin Time: 13 seconds (ref 11.4–15.2)

## 2020-09-04 LAB — GLUCOSE, CAPILLARY: Glucose-Capillary: 157 mg/dL — ABNORMAL HIGH (ref 70–99)

## 2020-09-04 NOTE — Progress Notes (Signed)
PCP - Dr. Arther Dames Cardiologist - denie  Chest x-ray - 09/04/20 EKG - 09/04/20 Stress Test - denies ECHO - denies Cardiac Cath - denies  Sleep Study - denies CPAP - denies  Fasting Blood Sugar - 145 Checks Blood Sugar __1___ time a day CBG 157 at PAT. Will collect A1C level  Blood Thinner Instructions: n/a Aspirin Instructions:n/a  ERAS Protcol -n/a PRE-SURGERY Ensure or G2- n/a  COVID TEST- 09/07/20   Anesthesia review: Yes, EKG review  Patient denies shortness of breath, fever, cough and chest pain at PAT appointment   All instructions explained to the patient, with a verbal understanding of the material. Patient agrees to go over the instructions while at home for a better understanding. Patient also instructed to self quarantine after being tested for COVID-19. The opportunity to ask questions was provided.

## 2020-09-05 ENCOUNTER — Encounter (HOSPITAL_COMMUNITY): Payer: Self-pay

## 2020-09-05 DIAGNOSIS — Z01818 Encounter for other preprocedural examination: Secondary | ICD-10-CM | POA: Diagnosis not present

## 2020-09-05 DIAGNOSIS — M48061 Spinal stenosis, lumbar region without neurogenic claudication: Secondary | ICD-10-CM | POA: Diagnosis not present

## 2020-09-05 LAB — HEPATIC FUNCTION PANEL
ALT: 40 U/L (ref 0–44)
AST: 27 U/L (ref 15–41)
Albumin: 4.1 g/dL (ref 3.5–5.0)
Alkaline Phosphatase: 40 U/L (ref 38–126)
Bilirubin, Direct: 0.2 mg/dL (ref 0.0–0.2)
Indirect Bilirubin: 0.8 mg/dL (ref 0.3–0.9)
Total Bilirubin: 1 mg/dL (ref 0.3–1.2)
Total Protein: 6.7 g/dL (ref 6.5–8.1)

## 2020-09-05 NOTE — Anesthesia Preprocedure Evaluation (Addendum)
Anesthesia Evaluation  Patient identified by MRN, date of birth, ID band Patient awake    Reviewed: Allergy & Precautions, NPO status , Patient's Chart, lab work & pertinent test results  History of Anesthesia Complications Negative for: history of anesthetic complications  Airway Mallampati: II  TM Distance: >3 FB Neck ROM: Full    Dental  (+) Teeth Intact, Dental Advisory Given   Pulmonary neg shortness of breath, neg sleep apnea, neg COPD, neg recent URI,  Covid-19 Nucleic Acid Test Results Lab Results      Component                Value               Date                      SARSCOV2NAA              NEGATIVE            09/07/2020              breath sounds clear to auscultation       Cardiovascular hypertension, Pt. on medications (-) angina(-) Past MI, (-) Cardiac Stents and (-) CHF (-) dysrhythmias  Rhythm:Regular     Neuro/Psych negative neurological ROS  negative psych ROS   GI/Hepatic negative GI ROS, Neg liver ROS,   Endo/Other  diabetesHypothyroidism   Renal/GU negative Renal ROSLab Results      Component                Value               Date                      CREATININE               1.14                09/04/2020                Musculoskeletal negative musculoskeletal ROS (+)   Abdominal   Peds  Hematology negative hematology ROS (+) Lab Results      Component                Value               Date                      WBC                      7.9                 09/04/2020                HGB                      15.7                09/04/2020                HCT                      42.8                09/04/2020                MCV  87.5                09/04/2020                PLT                      205                 09/04/2020              Anesthesia Other Findings   Reproductive/Obstetrics                            Anesthesia  Physical Anesthesia Plan  ASA: II  Anesthesia Plan: General   Post-op Pain Management:    Induction: Intravenous  PONV Risk Score and Plan: 2 and Ondansetron and Dexamethasone  Airway Management Planned: Oral ETT  Additional Equipment: None  Intra-op Plan:   Post-operative Plan: Extubation in OR  Informed Consent: I have reviewed the patients History and Physical, chart, labs and discussed the procedure including the risks, benefits and alternatives for the proposed anesthesia with the patient or authorized representative who has indicated his/her understanding and acceptance.     Dental advisory given  Plan Discussed with: CRNA and Surgeon  Anesthesia Plan Comments: (PAT note written 09/05/2020 by Shonna Chock, PA-C. )       Anesthesia Quick Evaluation

## 2020-09-05 NOTE — Progress Notes (Signed)
Anesthesia Chart Review:  Case: [id number] Date/Time: 09/10/20 1146   Procedure: Laminectomy and Foraminotomy - left - L3-L4 - L4-L5 - L5-S1 (Left Back) - 3C   Anesthesia type: General   Pre-op diagnosis: Stenosis   Location: [facility] OR ROOM 19 / [facility] OR   Surgeons: Tia Alert, MD      DISCUSSION: Patient is a 80 year old male scheduled for the above procedure.  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  I called and spoke with Mr. Benita Gutter. He denied cardiac history. He denied chest pain and SOB. 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 He recalls being told what sounds like Gilbert syndrome in the 1970's (he said it could make his LFTs elevated or eyes yellow). He denied any anesthesia complications but says he thinks it takes him a while to feel fully awake afterwards.   2nd Pfizer COVID-19 vaccine 02/29/20.  Preoperative COVID-19 test is scheduled for 09/07/2020.  Anesthesia team to evaluate on the day of surgery.   VS: BP (!) 149/71   Pulse 68   Temp 36.8 C (Oral)   Resp 18   Ht 6' (1.829 m)   Wt 83.5 kg   SpO2 100%   BMI 24.95 kg/m    PROVIDERS: Georgann Housekeeper, MD is PCP  Jerilee Field, MD    LABS: Labs reviewed: Acceptable for surgery. HFP added due to reported history of Gilbert's Syndrome with last LFTs nearly a year ago--HFP normal.  (all labs ordered are listed, but only abnormal results are displayed)  Labs Reviewed  GLUCOSE, CAPILLARY - Abnormal; Notable for the following components:      Result Value    Glucose-Capillary 157 (*)    All other components within normal limits  CBC WITH DIFFERENTIAL/PLATELET - Abnormal; Notable for the following components:   MCHC 36.7 (*)    All other components within normal limits  BASIC METABOLIC PANEL - Abnormal; Notable for the following components:   Potassium 3.4 (*)    Glucose, Bld 140 (*)    BUN 27 (*)    All other components within normal limits  HEMOGLOBIN A1C - Abnormal; Notable for the following components:   Hgb A1c MFr Bld 6.0 (*)    All other components within normal limits  SURGICAL PCR SCREEN  PROTIME-INR     IMAGES: CXR 09/04/20:  FINDINGS: Cardiomediastinal silhouette unchanged in size and contour. No evidence of central vascular congestion. No interlobular septal thickening. No pneumothorax or pleural effusion. No confluent airspace disease. No acute displaced fracture. Degenerative changes of the thoracic spine. IMPRESSION: Negative for acute cardiopulmonary disease  MRA Neck 10/06/19: IMPRESSION: Normal MRA of the neck.   EKG: 09/04/20: Sinus rhythm with Premature atrial complexes Nonspecific T wave abnormality Abnormal ECG No significant change since last tracing Confirmed by Kristeen Miss ([id number]) on 09/05/2020 5:36:42 AM   CV: N/A  Past Medical History:  Diagnosis Date  . BPH (benign prostatic hyperplasia)   . Diabetes mellitus without complication (HCC)   . Gilbert syndrome 1970's  . History of kidney stones   . Hypercholesteremia   . Hypertension   . Hypothyroidism     Past  Surgical History:  Procedure Laterality Date  . ANTERIOR CERVICAL DECOMP/DISCECTOMY FUSION  2007  . BACK SURGERY    . CHOLECYSTECTOMY    . LITHOTRIPSY     x2  . PARATHYROIDECTOMY      MEDICATIONS: . amLODipine (NORVASC) 5 MG tablet  . atorvastatin (LIPITOR) 20 MG tablet  . Cholecalciferol (VITAMIN D3) 125 MCG (5000 UT) TABS  . citalopram (CELEXA) 20 MG tablet  . Coenzyme Q10 (COQ-10) 100 MG CAPS  . glimepiride (AMARYL) 2  MG tablet  . ibuprofen (ADVIL) 200 MG tablet  . JANUVIA 50 MG tablet  . levothyroxine (SYNTHROID) 100 MCG tablet  . Multiple Vitamin (MULTIVITAMIN WITH MINERALS) TABS tablet  . quinapril-hydrochlorothiazide (ACCURETIC) 20-12.5 MG tablet   No current facility-administered medications for this encounter.    Shonna Chock, PA-C Surgical Short Stay/Anesthesiology [facility] Phone [phone] [facility] Phone [phone] 09/05/2020 4:12 PM

## 2020-09-07 ENCOUNTER — Other Ambulatory Visit (HOSPITAL_COMMUNITY)
Admission: RE | Admit: 2020-09-07 | Discharge: 2020-09-07 | Disposition: A | Discharge status: Home / Self Care | Payer: Medicare HMO | Source: Ambulatory Visit | Attending: Neurological Surgery | Admitting: Neurological Surgery

## 2020-09-07 DIAGNOSIS — Z20822 Contact with and (suspected) exposure to covid-19: Secondary | ICD-10-CM | POA: Diagnosis not present

## 2020-09-07 DIAGNOSIS — Z01812 Encounter for preprocedural laboratory examination: Secondary | ICD-10-CM | POA: Insufficient documentation

## 2020-09-07 LAB — SARS CORONAVIRUS 2 (TAT 6-24 HRS): SARS Coronavirus 2: NEGATIVE

## 2020-09-10 ENCOUNTER — Inpatient Hospital Stay (HOSPITAL_COMMUNITY): Payer: Medicare HMO | Admitting: Vascular Surgery

## 2020-09-10 ENCOUNTER — Other Ambulatory Visit: Payer: Self-pay

## 2020-09-10 ENCOUNTER — Inpatient Hospital Stay (HOSPITAL_COMMUNITY): Payer: Medicare HMO

## 2020-09-10 ENCOUNTER — Encounter (HOSPITAL_COMMUNITY): Payer: Self-pay | Admitting: Neurological Surgery

## 2020-09-10 ENCOUNTER — Inpatient Hospital Stay (HOSPITAL_COMMUNITY)
Admission: RE | Disposition: A | Discharge status: Home / Self Care | Payer: Self-pay | Source: Home / Self Care | Attending: Neurological Surgery

## 2020-09-10 ENCOUNTER — Inpatient Hospital Stay (HOSPITAL_COMMUNITY)
Admission: RE | Admit: 2020-09-10 | Discharge: 2020-09-11 | DRG: 520 | Disposition: A | Discharge status: Home / Self Care | Payer: Medicare HMO | Attending: Neurological Surgery | Admitting: Neurological Surgery

## 2020-09-10 DIAGNOSIS — Z419 Encounter for procedure for purposes other than remedying health state, unspecified: Secondary | ICD-10-CM

## 2020-09-10 DIAGNOSIS — I10 Essential (primary) hypertension: Secondary | ICD-10-CM | POA: Diagnosis not present

## 2020-09-10 DIAGNOSIS — Z20822 Contact with and (suspected) exposure to covid-19: Secondary | ICD-10-CM | POA: Diagnosis not present

## 2020-09-10 DIAGNOSIS — M5127 Other intervertebral disc displacement, lumbosacral region: Secondary | ICD-10-CM | POA: Diagnosis present

## 2020-09-10 DIAGNOSIS — M5116 Intervertebral disc disorders with radiculopathy, lumbar region: Secondary | ICD-10-CM | POA: Diagnosis present

## 2020-09-10 DIAGNOSIS — E7800 Pure hypercholesterolemia, unspecified: Secondary | ICD-10-CM | POA: Diagnosis not present

## 2020-09-10 DIAGNOSIS — M48061 Spinal stenosis, lumbar region without neurogenic claudication: Principal | ICD-10-CM | POA: Diagnosis present

## 2020-09-10 DIAGNOSIS — Z7984 Long term (current) use of oral hypoglycemic drugs: Secondary | ICD-10-CM

## 2020-09-10 DIAGNOSIS — N400 Enlarged prostate without lower urinary tract symptoms: Secondary | ICD-10-CM | POA: Diagnosis present

## 2020-09-10 DIAGNOSIS — Z888 Allergy status to other drugs, medicaments and biological substances status: Secondary | ICD-10-CM

## 2020-09-10 DIAGNOSIS — M5117 Intervertebral disc disorders with radiculopathy, lumbosacral region: Secondary | ICD-10-CM | POA: Diagnosis not present

## 2020-09-10 DIAGNOSIS — Z981 Arthrodesis status: Secondary | ICD-10-CM

## 2020-09-10 DIAGNOSIS — Z87442 Personal history of urinary calculi: Secondary | ICD-10-CM | POA: Diagnosis not present

## 2020-09-10 DIAGNOSIS — Z79890 Hormone replacement therapy (postmenopausal): Secondary | ICD-10-CM

## 2020-09-10 DIAGNOSIS — E890 Postprocedural hypothyroidism: Secondary | ICD-10-CM | POA: Diagnosis not present

## 2020-09-10 DIAGNOSIS — E119 Type 2 diabetes mellitus without complications: Secondary | ICD-10-CM | POA: Diagnosis present

## 2020-09-10 DIAGNOSIS — Z9889 Other specified postprocedural states: Secondary | ICD-10-CM

## 2020-09-10 DIAGNOSIS — Z79899 Other long term (current) drug therapy: Secondary | ICD-10-CM

## 2020-09-10 DIAGNOSIS — M5126 Other intervertebral disc displacement, lumbar region: Secondary | ICD-10-CM | POA: Diagnosis not present

## 2020-09-10 DIAGNOSIS — Z9049 Acquired absence of other specified parts of digestive tract: Secondary | ICD-10-CM

## 2020-09-10 HISTORY — PX: LUMBAR LAMINECTOMY/DECOMPRESSION MICRODISCECTOMY: SHX5026

## 2020-09-10 LAB — GLUCOSE, CAPILLARY
Glucose-Capillary: 177 mg/dL — ABNORMAL HIGH (ref 70–99)
Glucose-Capillary: 177 mg/dL — ABNORMAL HIGH (ref 70–99)
Glucose-Capillary: 250 mg/dL — ABNORMAL HIGH (ref 70–99)

## 2020-09-10 SURGERY — LUMBAR LAMINECTOMY/DECOMPRESSION MICRODISCECTOMY 3 LEVELS
Anesthesia: General | Site: Back | Laterality: Left

## 2020-09-10 MED ORDER — SODIUM CHLORIDE 0.9 % IV SOLN: 250.0000 mL | INTRAVENOUS | Status: DC

## 2020-09-10 MED ORDER — ONDANSETRON HCL 4 MG/2ML IJ SOLN: 4.0000 mg | Freq: Four times a day (QID) | INTRAMUSCULAR | Status: DC | PRN

## 2020-09-10 MED ORDER — ACETAMINOPHEN 325 MG PO TABS: 650.0000 mg | ORAL_TABLET | ORAL | Status: DC | PRN

## 2020-09-10 MED ORDER — ORAL CARE MOUTH RINSE: 15.0000 mL | Freq: Once | OROMUCOSAL | Status: AC

## 2020-09-10 MED ORDER — AMLODIPINE BESYLATE 5 MG PO TABS
5.0000 mg | ORAL_TABLET | Freq: Every day | ORAL | Status: DC
  Administered 2020-09-11: 5 mg via ORAL
  Filled 2020-09-10: qty 1

## 2020-09-10 MED ORDER — PHENOL 1.4 % MT LIQD: 1.0000 | OROMUCOSAL | Status: DC | PRN

## 2020-09-10 MED ORDER — HYDROCODONE-ACETAMINOPHEN 7.5-325 MG PO TABS
1.0000 | ORAL_TABLET | Freq: Four times a day (QID) | ORAL | Status: DC
Start: 2020-09-10 — End: 2020-09-11
  Administered 2020-09-10 – 2020-09-11 (×3): 1 via ORAL
  Filled 2020-09-10 (×3): qty 1

## 2020-09-10 MED ORDER — METHOCARBAMOL 1000 MG/10ML IJ SOLN
500.0000 mg | Freq: Four times a day (QID) | INTRAVENOUS | Status: DC | PRN
  Filled 2020-09-10: qty 5

## 2020-09-10 MED ORDER — THROMBIN 5000 UNITS EX SOLR
CUTANEOUS | Status: AC
  Filled 2020-09-10: qty 5000

## 2020-09-10 MED ORDER — THROMBIN 5000 UNITS EX SOLR
OROMUCOSAL | Status: DC | PRN
  Administered 2020-09-10: 5 mL via TOPICAL

## 2020-09-10 MED ORDER — DEXAMETHASONE SODIUM PHOSPHATE 4 MG/ML IJ SOLN
INTRAMUSCULAR | Status: DC | PRN
  Administered 2020-09-10: 5 mg via INTRAVENOUS

## 2020-09-10 MED ORDER — ONDANSETRON HCL 4 MG/2ML IJ SOLN
INTRAMUSCULAR | Status: AC
  Filled 2020-09-10: qty 2

## 2020-09-10 MED ORDER — PHENYLEPHRINE HCL (PRESSORS) 10 MG/ML IV SOLN
INTRAVENOUS | Status: AC
  Filled 2020-09-10: qty 1

## 2020-09-10 MED ORDER — METHOCARBAMOL 500 MG PO TABS
500.0000 mg | ORAL_TABLET | Freq: Four times a day (QID) | ORAL | Status: DC | PRN
  Administered 2020-09-10: 500 mg via ORAL
  Filled 2020-09-10: qty 1

## 2020-09-10 MED ORDER — SUGAMMADEX SODIUM 200 MG/2ML IV SOLN
INTRAVENOUS | Status: DC | PRN
  Administered 2020-09-10: 400 mg via INTRAVENOUS

## 2020-09-10 MED ORDER — CHLORHEXIDINE GLUCONATE 0.12 % MT SOLN
15.0000 mL | Freq: Once | OROMUCOSAL | Status: AC
  Administered 2020-09-10: 15 mL via OROMUCOSAL
  Filled 2020-09-10: qty 15

## 2020-09-10 MED ORDER — QUINAPRIL-HYDROCHLOROTHIAZIDE 20-12.5 MG PO TABS: 1.0000 | ORAL_TABLET | Freq: Every day | ORAL | Status: DC

## 2020-09-10 MED ORDER — FENTANYL CITRATE (PF) 100 MCG/2ML IJ SOLN
INTRAMUSCULAR | Status: AC
  Filled 2020-09-10: qty 2

## 2020-09-10 MED ORDER — EPHEDRINE 5 MG/ML INJ
INTRAVENOUS | Status: AC
  Filled 2020-09-10: qty 10

## 2020-09-10 MED ORDER — GABAPENTIN 300 MG PO CAPS
300.0000 mg | ORAL_CAPSULE | ORAL | Status: AC
  Administered 2020-09-10: 300 mg via ORAL
  Filled 2020-09-10: qty 1

## 2020-09-10 MED ORDER — FENTANYL CITRATE (PF) 100 MCG/2ML IJ SOLN
INTRAMUSCULAR | Status: DC | PRN
  Administered 2020-09-10: 100 ug via INTRAVENOUS

## 2020-09-10 MED ORDER — ACETAMINOPHEN 160 MG/5ML PO SOLN: 1000.0000 mg | Freq: Once | ORAL | Status: DC | PRN

## 2020-09-10 MED ORDER — DEXAMETHASONE SODIUM PHOSPHATE 10 MG/ML IJ SOLN
10.0000 mg | Freq: Once | INTRAMUSCULAR | Status: DC
  Filled 2020-09-10: qty 1

## 2020-09-10 MED ORDER — CITALOPRAM HYDROBROMIDE 20 MG PO TABS
20.0000 mg | ORAL_TABLET | Freq: Every evening | ORAL | Status: DC
  Administered 2020-09-10: 20 mg via ORAL
  Filled 2020-09-10: qty 1

## 2020-09-10 MED ORDER — ACETAMINOPHEN 10 MG/ML IV SOLN
INTRAVENOUS | Status: AC
  Filled 2020-09-10: qty 100

## 2020-09-10 MED ORDER — CEFAZOLIN SODIUM-DEXTROSE 2-4 GM/100ML-% IV SOLN
2.0000 g | Freq: Three times a day (TID) | INTRAVENOUS | Status: AC
  Administered 2020-09-10 – 2020-09-11 (×2): 2 g via INTRAVENOUS
  Filled 2020-09-10 (×2): qty 100

## 2020-09-10 MED ORDER — PHENYLEPHRINE HCL-NACL 10-0.9 MG/250ML-% IV SOLN
INTRAVENOUS | Status: DC | PRN
  Administered 2020-09-10: 20 ug/min via INTRAVENOUS

## 2020-09-10 MED ORDER — POTASSIUM CHLORIDE IN NACL 20-0.9 MEQ/L-% IV SOLN: INTRAVENOUS | Status: DC

## 2020-09-10 MED ORDER — FENTANYL CITRATE (PF) 250 MCG/5ML IJ SOLN
INTRAMUSCULAR | Status: AC
  Filled 2020-09-10: qty 5

## 2020-09-10 MED ORDER — OXYCODONE HCL 5 MG PO TABS: 5.0000 mg | ORAL_TABLET | Freq: Once | ORAL | Status: DC | PRN

## 2020-09-10 MED ORDER — EPHEDRINE SULFATE-NACL 50-0.9 MG/10ML-% IV SOSY
PREFILLED_SYRINGE | INTRAVENOUS | Status: DC | PRN
  Administered 2020-09-10: 10 mg via INTRAVENOUS
  Administered 2020-09-10: 5 mg via INTRAVENOUS

## 2020-09-10 MED ORDER — CHLORHEXIDINE GLUCONATE CLOTH 2 % EX PADS: 6.0000 | MEDICATED_PAD | Freq: Once | CUTANEOUS | Status: DC

## 2020-09-10 MED ORDER — ACETAMINOPHEN 10 MG/ML IV SOLN
1000.0000 mg | Freq: Once | INTRAVENOUS | Status: DC | PRN
  Administered 2020-09-10: 1000 mg via INTRAVENOUS

## 2020-09-10 MED ORDER — METHOCARBAMOL 500 MG PO TABS
ORAL_TABLET | ORAL | Status: AC
  Filled 2020-09-10: qty 1

## 2020-09-10 MED ORDER — ACETAMINOPHEN 500 MG PO TABS: 1000.0000 mg | ORAL_TABLET | Freq: Once | ORAL | Status: DC | PRN

## 2020-09-10 MED ORDER — PROPOFOL 10 MG/ML IV BOLUS
INTRAVENOUS | Status: DC | PRN
  Administered 2020-09-10 (×2): 30 mg via INTRAVENOUS
  Administered 2020-09-10: 60 mg via INTRAVENOUS

## 2020-09-10 MED ORDER — ROCURONIUM BROMIDE 10 MG/ML (PF) SYRINGE
PREFILLED_SYRINGE | INTRAVENOUS | Status: AC
  Filled 2020-09-10: qty 10

## 2020-09-10 MED ORDER — PHENYLEPHRINE 40 MCG/ML (10ML) SYRINGE FOR IV PUSH (FOR BLOOD PRESSURE SUPPORT)
PREFILLED_SYRINGE | INTRAVENOUS | Status: AC
  Filled 2020-09-10: qty 10

## 2020-09-10 MED ORDER — THROMBIN (RECOMBINANT) 5000 UNITS EX SOLR
CUTANEOUS | Status: AC
  Filled 2020-09-10: qty 5000

## 2020-09-10 MED ORDER — LIDOCAINE 2% (20 MG/ML) 5 ML SYRINGE
INTRAMUSCULAR | Status: AC
  Filled 2020-09-10: qty 5

## 2020-09-10 MED ORDER — ARTIFICIAL TEARS OPHTHALMIC OINT
TOPICAL_OINTMENT | OPHTHALMIC | Status: AC
  Filled 2020-09-10: qty 3.5

## 2020-09-10 MED ORDER — INSULIN ASPART 100 UNIT/ML ~~LOC~~ SOLN: 0.0000 [IU] | Freq: Three times a day (TID) | SUBCUTANEOUS | Status: DC

## 2020-09-10 MED ORDER — CEFAZOLIN SODIUM-DEXTROSE 2-4 GM/100ML-% IV SOLN
2.0000 g | INTRAVENOUS | Status: AC
  Administered 2020-09-10: 2 g via INTRAVENOUS
  Filled 2020-09-10: qty 100

## 2020-09-10 MED ORDER — ACETAMINOPHEN 500 MG PO TABS
1000.0000 mg | ORAL_TABLET | ORAL | Status: AC
  Administered 2020-09-10: 1000 mg via ORAL
  Filled 2020-09-10: qty 2

## 2020-09-10 MED ORDER — SENNA 8.6 MG PO TABS
1.0000 | ORAL_TABLET | Freq: Two times a day (BID) | ORAL | Status: DC
  Administered 2020-09-10: 8.6 mg via ORAL
  Filled 2020-09-10: qty 1

## 2020-09-10 MED ORDER — LIDOCAINE 2% (20 MG/ML) 5 ML SYRINGE
INTRAMUSCULAR | Status: DC | PRN
  Administered 2020-09-10: 40 mg via INTRAVENOUS

## 2020-09-10 MED ORDER — ROCURONIUM BROMIDE 100 MG/10ML IV SOLN
INTRAVENOUS | Status: DC | PRN
  Administered 2020-09-10: 60 mg via INTRAVENOUS
  Administered 2020-09-10: 40 mg via INTRAVENOUS

## 2020-09-10 MED ORDER — HYDROCHLOROTHIAZIDE 12.5 MG PO CAPS
12.5000 mg | ORAL_CAPSULE | Freq: Every day | ORAL | Status: DC
  Administered 2020-09-10 – 2020-09-11 (×2): 12.5 mg via ORAL
  Filled 2020-09-10 (×2): qty 1

## 2020-09-10 MED ORDER — BUPIVACAINE HCL (PF) 0.25 % IJ SOLN
INTRAMUSCULAR | Status: DC | PRN
  Administered 2020-09-10: 7 mL

## 2020-09-10 MED ORDER — CELECOXIB 200 MG PO CAPS
200.0000 mg | ORAL_CAPSULE | Freq: Two times a day (BID) | ORAL | Status: DC
  Administered 2020-09-10 – 2020-09-11 (×2): 200 mg via ORAL
  Filled 2020-09-10 (×2): qty 1

## 2020-09-10 MED ORDER — ONDANSETRON HCL 4 MG PO TABS: 4.0000 mg | ORAL_TABLET | Freq: Four times a day (QID) | ORAL | Status: DC | PRN

## 2020-09-10 MED ORDER — MORPHINE SULFATE (PF) 2 MG/ML IV SOLN: 2.0000 mg | INTRAVENOUS | Status: DC | PRN

## 2020-09-10 MED ORDER — LINAGLIPTIN 5 MG PO TABS
5.0000 mg | ORAL_TABLET | Freq: Every day | ORAL | Status: DC
  Administered 2020-09-10 – 2020-09-11 (×2): 5 mg via ORAL
  Filled 2020-09-10 (×2): qty 1

## 2020-09-10 MED ORDER — LACTATED RINGERS IV SOLN: INTRAVENOUS | Status: DC

## 2020-09-10 MED ORDER — SODIUM CHLORIDE 0.9% FLUSH: 3.0000 mL | Freq: Two times a day (BID) | INTRAVENOUS | Status: DC

## 2020-09-10 MED ORDER — FENTANYL CITRATE (PF) 100 MCG/2ML IJ SOLN
25.0000 ug | INTRAMUSCULAR | Status: DC | PRN
  Administered 2020-09-10: 50 ug via INTRAVENOUS

## 2020-09-10 MED ORDER — BUPIVACAINE HCL (PF) 0.25 % IJ SOLN
INTRAMUSCULAR | Status: AC
  Filled 2020-09-10: qty 30

## 2020-09-10 MED ORDER — LEVOTHYROXINE SODIUM 100 MCG PO TABS
100.0000 ug | ORAL_TABLET | Freq: Every day | ORAL | Status: DC
  Administered 2020-09-11: 100 ug via ORAL
  Filled 2020-09-10: qty 1

## 2020-09-10 MED ORDER — 0.9 % SODIUM CHLORIDE (POUR BTL) OPTIME
TOPICAL | Status: DC | PRN
  Administered 2020-09-10: 1000 mL

## 2020-09-10 MED ORDER — ACETAMINOPHEN 650 MG RE SUPP
650.0000 mg | RECTAL | Status: DC | PRN
Start: 2020-09-10 — End: 2020-09-11

## 2020-09-10 MED ORDER — PHENYLEPHRINE 40 MCG/ML (10ML) SYRINGE FOR IV PUSH (FOR BLOOD PRESSURE SUPPORT)
PREFILLED_SYRINGE | INTRAVENOUS | Status: DC | PRN
  Administered 2020-09-10: 40 ug via INTRAVENOUS
  Administered 2020-09-10 (×2): 80 ug via INTRAVENOUS

## 2020-09-10 MED ORDER — LISINOPRIL 20 MG PO TABS
20.0000 mg | ORAL_TABLET | Freq: Every day | ORAL | Status: DC
  Administered 2020-09-10 – 2020-09-11 (×2): 20 mg via ORAL
  Filled 2020-09-10 (×2): qty 1

## 2020-09-10 MED ORDER — DEXAMETHASONE SODIUM PHOSPHATE 10 MG/ML IJ SOLN
INTRAMUSCULAR | Status: AC
  Filled 2020-09-10: qty 1

## 2020-09-10 MED ORDER — SODIUM CHLORIDE 0.9% FLUSH: 3.0000 mL | INTRAVENOUS | Status: DC | PRN

## 2020-09-10 MED ORDER — OXYCODONE HCL 5 MG/5ML PO SOLN: 5.0000 mg | Freq: Once | ORAL | Status: DC | PRN

## 2020-09-10 MED ORDER — MENTHOL 3 MG MT LOZG: 1.0000 | LOZENGE | OROMUCOSAL | Status: DC | PRN

## 2020-09-10 MED ORDER — ONDANSETRON HCL 4 MG/2ML IJ SOLN
INTRAMUSCULAR | Status: DC | PRN
  Administered 2020-09-10: 4 mg via INTRAVENOUS

## 2020-09-10 MED ORDER — GLIMEPIRIDE 2 MG PO TABS: 2.0000 mg | ORAL_TABLET | Freq: Every day | ORAL | Status: DC

## 2020-09-10 SURGICAL SUPPLY — 47 items
ADH SKN CLS APL DERMABOND .7 (GAUZE/BANDAGES/DRESSINGS) ×1
APL SKNCLS STERI-STRIP NONHPOA (GAUZE/BANDAGES/DRESSINGS) ×1
BAND INSRT 18 STRL LF DISP RB (MISCELLANEOUS) ×2
BAND RUBBER #18 3X1/16 STRL (MISCELLANEOUS) ×4 IMPLANT
BENZOIN TINCTURE PRP APPL 2/3 (GAUZE/BANDAGES/DRESSINGS) ×2 IMPLANT
BUR CARBIDE MATCH 3.0 (BURR) ×2 IMPLANT
CANISTER SUCT 3000ML PPV (MISCELLANEOUS) ×2 IMPLANT
CLSR STERI-STRIP ANTIMIC 1/2X4 (GAUZE/BANDAGES/DRESSINGS) ×1 IMPLANT
COVER WAND RF STERILE (DRAPES) ×2 IMPLANT
DERMABOND ADVANCED (GAUZE/BANDAGES/DRESSINGS) ×1
DERMABOND ADVANCED .7 DNX12 (GAUZE/BANDAGES/DRESSINGS) IMPLANT
DIFFUSER DRILL AIR PNEUMATIC (MISCELLANEOUS) ×1 IMPLANT
DRAPE LAPAROTOMY 100X72X124 (DRAPES) ×2 IMPLANT
DRAPE MICROSCOPE LEICA (MISCELLANEOUS) ×2 IMPLANT
DRAPE SURG 17X23 STRL (DRAPES) ×2 IMPLANT
DRSG OPSITE POSTOP 4X6 (GAUZE/BANDAGES/DRESSINGS) ×1 IMPLANT
DURAPREP 26ML APPLICATOR (WOUND CARE) ×2 IMPLANT
ELECT REM PT RETURN 9FT ADLT (ELECTROSURGICAL) ×2
ELECTRODE REM PT RTRN 9FT ADLT (ELECTROSURGICAL) ×1 IMPLANT
GAUZE 4X4 16PLY RFD (DISPOSABLE) IMPLANT
GLOVE BIO SURGEON STRL SZ7 (GLOVE) ×2 IMPLANT
GLOVE BIO SURGEON STRL SZ8 (GLOVE) ×2 IMPLANT
GLOVE SURG UNDER POLY LF SZ7 (GLOVE) ×1 IMPLANT
GOWN STRL REUS W/ TWL LRG LVL3 (GOWN DISPOSABLE) IMPLANT
GOWN STRL REUS W/ TWL XL LVL3 (GOWN DISPOSABLE) ×1 IMPLANT
GOWN STRL REUS W/TWL 2XL LVL3 (GOWN DISPOSABLE) ×1 IMPLANT
GOWN STRL REUS W/TWL LRG LVL3 (GOWN DISPOSABLE) ×2
GOWN STRL REUS W/TWL XL LVL3 (GOWN DISPOSABLE) ×2
HEMOSTAT POWDER KIT SURGIFOAM (HEMOSTASIS) ×1 IMPLANT
KIT BASIN OR (CUSTOM PROCEDURE TRAY) ×2 IMPLANT
KIT TURNOVER KIT B (KITS) ×2 IMPLANT
NDL HYPO 25X1 1.5 SAFETY (NEEDLE) ×1 IMPLANT
NDL SPNL 20GX3.5 QUINCKE YW (NEEDLE) IMPLANT
NEEDLE HYPO 25X1 1.5 SAFETY (NEEDLE) ×2 IMPLANT
NEEDLE SPNL 20GX3.5 QUINCKE YW (NEEDLE) IMPLANT
NS IRRIG 1000ML POUR BTL (IV SOLUTION) ×2 IMPLANT
PACK LAMINECTOMY NEURO (CUSTOM PROCEDURE TRAY) ×2 IMPLANT
PAD ARMBOARD 7.5X6 YLW CONV (MISCELLANEOUS) ×6 IMPLANT
SPONGE SURGIFOAM ABS GEL SZ50 (HEMOSTASIS) IMPLANT
STRIP CLOSURE SKIN 1/2X4 (GAUZE/BANDAGES/DRESSINGS) ×2 IMPLANT
SUT VIC AB 0 CT1 18XCR BRD8 (SUTURE) ×1 IMPLANT
SUT VIC AB 0 CT1 8-18 (SUTURE) ×4
SUT VIC AB 2-0 CP2 18 (SUTURE) ×2 IMPLANT
SUT VIC AB 3-0 SH 8-18 (SUTURE) ×2 IMPLANT
TOWEL GREEN STERILE (TOWEL DISPOSABLE) ×2 IMPLANT
TOWEL GREEN STERILE FF (TOWEL DISPOSABLE) ×2 IMPLANT
WATER STERILE IRR 1000ML POUR (IV SOLUTION) ×2 IMPLANT

## 2020-09-10 NOTE — Anesthesia Procedure Notes (Signed)
Procedure Name: Intubation Date/Time: 09/10/2020 4:19 PM Performed by: Mayer Camel, CRNA Pre-anesthesia Checklist: Patient identified, Emergency Drugs available, Suction available and Patient being monitored Patient Re-evaluated:Patient Re-evaluated prior to induction Oxygen Delivery Method: Circle System Utilized Preoxygenation: Pre-oxygenation with 100% oxygen Induction Type: IV induction Ventilation: Mask ventilation without difficulty and Oral airway inserted - appropriate to patient size Laryngoscope Size: Miller and 3 Tube type: Oral Tube size: 7.5 mm Number of attempts: 1 Airway Equipment and Method: Stylet and Oral airway Placement Confirmation: ETT inserted through vocal cords under direct vision,  positive ETCO2 and breath sounds checked- equal and bilateral Secured at: 22 cm Tube secured with: Tape Dental Injury: Teeth and Oropharynx as per pre-operative assessment

## 2020-09-10 NOTE — Op Note (Signed)
09/10/2020  6:09 PM  PATIENT:  Peter Aguilar  80 y.o. male  PRE-OPERATIVE DIAGNOSIS: Lumbar spinal stenosis L3-4 L4-5 L5-S1 with disc herniation L5-S1 left  POST-OPERATIVE DIAGNOSIS:  same  PROCEDURE: Left L3-4 L4-5 L5-S1 hemilaminectomy medial facetectomy foraminotomy with discectomy L5-S1 on the left  SURGEON:  Marikay Alar, MD  ASSISTANTS: Verlin Dike, FNP  ANESTHESIA:   General  EBL: 25 ml  Total I/O In: 1300 [I.V.:1200; IV Piggyback:100] Out: 25 [Blood:25]  BLOOD ADMINISTERED: none  DRAINS: none  SPECIMEN:  none  INDICATION FOR PROCEDURE: This patient presented with left leg pain. Imaging showed lumbar stenosis L3-4 L4-5 L5-S1 with a left-sided disc protrusion L5-S1. The patient tried conservative measures without relief. Pain was debilitating. Recommended left L3-4 L4-5 and L5-S1 hemilaminectomies with possible discectomy L5-S1. Patient understood the risks, benefits, and alternatives and potential outcomes and wished to proceed.  PROCEDURE DETAILS: The patient was taken to the operating room and after induction of adequate generalized endotracheal anesthesia, the patient was rolled into the prone position on the Wilson frame and all pressure points were padded. The lumbar region was cleaned and then prepped with DuraPrep and draped in the usual sterile fashion. 5 cc of local anesthesia was injected and then a dorsal midline incision was made and carried down to the lumbo sacral fascia. The fascia was opened and the paraspinous musculature was taken down in a subperiosteal fashion to expose L3-4 L4-5 and L5-S1 on the left. Intraoperative x-ray confirmed my level, and then I used a combination of the high-speed drill and the Kerrison punches to perform a hemilaminectomy, medial facetectomy, and foraminotomy at L3-4 L4-5 and L5-S1 on the left. The underlying yellow ligament was opened and removed in a piecemeal fashion to expose the underlying dura and exiting nerve root. I  undercut the lateral recess and dissected down until I was medial to and distal to the pedicle. The nerve root was well decompressed.  At L5-S1 on the left, we then gently retracted the nerve root medially with a retractor, coagulated the epidural venous vasculature, and found a fairly significant subannular disc herniation compressing the left S1 nerve root.  I incised the disc space. I performed a thorough intradiscal discectomy with pituitary rongeurs and curettes, until I had a nice decompression of the nerve root and the midline. I then palpated with a coronary dilator along the nerve root and into the foramen to assure adequate decompression. I felt no more compression of the nerve root.  We also palpated at L3-4 and L4-5 on the left.  The nerves appear to be free.  We did undercut the lateral recess to decompress the lateral recess.  I irrigated with saline solution containing bacitracin. Achieved hemostasis with bipolar cautery, and then closed the fascia with 0 Vicryl. I closed the subcutaneous tissues with 2-0 Vicryl and the subcuticular tissues with 3-0 Vicryl. The skin was then closed with benzoin and Steri-Strips. The drapes were removed, a sterile dressing was applied.  My nurse practitioner was involved in the exposure, safe retraction of the neural elements, the disc work and the closure. the patient was awakened from general anesthesia and transferred to the recovery room in stable condition. At the end of the procedure all sponge, needle and instrument counts were correct.    PLAN OF CARE: Admit for overnight observation  PATIENT DISPOSITION:  PACU - hemodynamically stable.   Delay start of Pharmacological VTE agent (>24hrs) due to surgical blood loss or risk of bleeding:  yes

## 2020-09-10 NOTE — Transfer of Care (Signed)
Immediate Anesthesia Transfer of Care Note  Patient: Peter Aguilar  Procedure(s) Performed: Laminectomy and Foraminotomy - left - Lumbar three-Lumbar four - Lumbar four-Lumbar five - Lumbar five-Sacral one (Left Back)  Patient Location: PACU  Anesthesia Type:General  Level of Consciousness: awake, alert  and oriented  Airway & Oxygen Therapy: Patient Spontanous Breathing and Patient connected to face mask oxygen  Post-op Assessment: Report given to RN and Post -op Vital signs reviewed and stable  Post vital signs: Reviewed and stable  Last Vitals:  Vitals Value Taken Time  BP 170/90 09/10/20 1811  Temp    Pulse 85 09/10/20 1816  Resp 15 09/10/20 1816  SpO2 98 % 09/10/20 1816  Vitals shown include unvalidated device data.  Last Pain:  Vitals:   09/10/20 0952  TempSrc:   PainSc: 0-No pain         Complications: No complications documented.

## 2020-09-10 NOTE — H&P (Signed)
Subjective: Patient is a 80 y.o. male admitted for L leg pain. Onset of symptoms was several months ago, gradually worsening since that time.  The pain is rated severe, and is located at the across the lower back and radiates to LLE. The pain is described as aching and occurs all day. The symptoms have been progressive. Symptoms are exacerbated by exercise and standing. MRI or CT showed stenosis   Past Medical History:  Diagnosis Date  . BPH (benign prostatic hyperplasia)   . Diabetes mellitus without complication (HCC)   . Gilbert syndrome 1970's  . History of kidney stones   . Hypercholesteremia   . Hypertension   . Hypothyroidism     Past Surgical History:  Procedure Laterality Date  . ANTERIOR CERVICAL DECOMP/DISCECTOMY FUSION  2007  . BACK SURGERY  04/2019  . CHOLECYSTECTOMY    . LITHOTRIPSY     x2  . PARATHYROIDECTOMY      Prior to Admission medications   Medication Sig Start Date End Date Taking? Authorizing Provider  amLODipine (NORVASC) 5 MG tablet Take 5 mg by mouth daily.   Yes [provider]  atorvastatin (LIPITOR) 20 MG tablet Take 20 mg by mouth every evening. 07/13/20  Yes [provider]  Cholecalciferol (VITAMIN D3) 125 MCG (5000 UT) TABS Take 5,000 Units by mouth daily.   Yes [provider]  citalopram (CELEXA) 20 MG tablet Take 20 mg by mouth every evening.   Yes [provider]  Coenzyme Q10 (COQ-10) 100 MG CAPS Take 100 mg by mouth daily.   Yes [provider]  glimepiride (AMARYL) 2 MG tablet Take 2 mg by mouth daily with supper.   Yes [provider]  JANUVIA 50 MG tablet Take 50 mg by mouth daily. 07/29/20  Yes [provider]  levothyroxine (SYNTHROID) 100 MCG tablet Take 100 mcg by mouth daily. 06/15/20  Yes [provider]  Multiple Vitamin (MULTIVITAMIN WITH MINERALS) TABS tablet Take 1 tablet by mouth daily.   Yes [provider]  quinapril-hydrochlorothiazide (ACCURETIC)  20-12.5 MG tablet Take 1 tablet by mouth in the morning and at bedtime.   Yes [provider]  ibuprofen (ADVIL) 200 MG tablet Take 600 mg by mouth at bedtime.    [provider]   Allergies  Allergen Reactions  . Metformin And Related Other (See Comments)    Upset stomach  . Tetracyclines & Related Rash and Other (See Comments)    Sun exposure caused rash    Social History   Tobacco Use  . Smoking status: Never Smoker  . Smokeless tobacco: Current User    Types: Chew  Substance Use Topics  . Alcohol use: Yes    History reviewed. No pertinent family history.   Review of Systems  Positive ROS: neg  All other systems have been reviewed and were otherwise negative with the exception of those mentioned in the HPI and as above.  Objective: Vital signs in last 24 hours: Temp:  [97.1 F (36.2 C)] 97.1 F (36.2 C) (02/28 0937) Pulse Rate:  [81] 81 (02/28 0937) Resp:  [18] 18 (02/28 0937) BP: (161-170)/(80-88) 161/88 (02/28 1000) SpO2:  [96 %] 96 % (02/28 0937) Weight:  [83.9 kg] 83.9 kg (02/28 0937)  General Appearance: Alert, cooperative, no distress, appears stated age Head: Normocephalic, without obvious abnormality, atraumatic Eyes: PERRL, conjunctiva/corneas clear, EOM's intact    Neck: Supple, symmetrical, trachea midline Back: Symmetric, no curvature, ROM normal, no CVA tenderness Lungs:  respirations unlabored  Heart: Regular rate and rhythm Abdomen: Soft, non-tender Extremities: Extremities normal, atraumatic, no cyanosis or edema Pulses: 2+ and symmetric all extremities Skin: Skin color, texture, turgor normal, no rashes or lesions  NEUROLOGIC:   Mental status: Alert and oriented x4,  no aphasia, good attention span, fund of knowledge, and memory Motor Exam - grossly normal Sensory Exam - grossly normal Reflexes: 1+ Coordination - grossly normal Gait - grossly normal Balance - grossly normal Cranial Nerves: I: smell Not tested  II:  visual acuity  OS: nl    OD: nl  II: visual fields Full to confrontation  II: pupils Equal, round, reactive to light  III,VII: ptosis None  III,IV,VI: extraocular muscles  Full ROM  V: mastication Normal  V: facial light touch sensation  Normal  V,VII: corneal reflex  Present  VII: facial muscle function - upper  Normal  VII: facial muscle function - lower Normal  VIII: hearing Not tested  IX: soft palate elevation  Normal  IX,X: gag reflex Present  XI: trapezius strength  5/5  XI: sternocleidomastoid strength 5/5  XI: neck flexion strength  5/5  XII: tongue strength  Normal    Data Review Lab Results  Component Value Date   WBC 7.9 09/04/2020   HGB 15.7 09/04/2020   HCT 42.8 09/04/2020   MCV 87.5 09/04/2020   PLT 205 09/04/2020   Lab Results  Component Value Date   NA 142 09/04/2020   K 3.4 (L) 09/04/2020   CL 102 09/04/2020   CO2 29 09/04/2020   BUN 27 (H) 09/04/2020   CREATININE 1.14 09/04/2020   GLUCOSE 140 (H) 09/04/2020   Lab Results  Component Value Date   INR 1.0 09/04/2020    Assessment/Plan:  Estimated body mass index is 25.09 kg/m as calculated from the following:   Height as of this encounter: 6' (1.829 m).   Weight as of this encounter: 83.9 kg. Patient admitted for LL L3-4 L4-5 L5-S1 L. Patient has failed a reasonable attempt at conservative therapy.  I explained the condition and procedure to the patient and answered any questions.  Patient wishes to proceed with procedure as planned. Understands risks/ benefits and typical outcomes of procedure.   Tia Alert 09/10/2020 4:02 PM

## 2020-09-10 NOTE — Progress Notes (Signed)
Yellow-tone wedding band taken to wife per patient's request by Lindsi, RN

## 2020-09-11 ENCOUNTER — Encounter (HOSPITAL_COMMUNITY): Payer: Self-pay | Admitting: Neurological Surgery

## 2020-09-11 DIAGNOSIS — M48061 Spinal stenosis, lumbar region without neurogenic claudication: Secondary | ICD-10-CM | POA: Diagnosis present

## 2020-09-11 DIAGNOSIS — M5127 Other intervertebral disc displacement, lumbosacral region: Secondary | ICD-10-CM | POA: Diagnosis present

## 2020-09-11 DIAGNOSIS — Z87442 Personal history of urinary calculi: Secondary | ICD-10-CM | POA: Diagnosis not present

## 2020-09-11 DIAGNOSIS — Z79899 Other long term (current) drug therapy: Secondary | ICD-10-CM | POA: Diagnosis not present

## 2020-09-11 DIAGNOSIS — Z888 Allergy status to other drugs, medicaments and biological substances status: Secondary | ICD-10-CM | POA: Diagnosis not present

## 2020-09-11 DIAGNOSIS — E890 Postprocedural hypothyroidism: Secondary | ICD-10-CM | POA: Diagnosis present

## 2020-09-11 DIAGNOSIS — Z20822 Contact with and (suspected) exposure to covid-19: Secondary | ICD-10-CM | POA: Diagnosis present

## 2020-09-11 DIAGNOSIS — Z7984 Long term (current) use of oral hypoglycemic drugs: Secondary | ICD-10-CM | POA: Diagnosis not present

## 2020-09-11 DIAGNOSIS — E119 Type 2 diabetes mellitus without complications: Secondary | ICD-10-CM | POA: Diagnosis present

## 2020-09-11 DIAGNOSIS — Z79890 Hormone replacement therapy (postmenopausal): Secondary | ICD-10-CM | POA: Diagnosis not present

## 2020-09-11 DIAGNOSIS — Z9049 Acquired absence of other specified parts of digestive tract: Secondary | ICD-10-CM | POA: Diagnosis not present

## 2020-09-11 DIAGNOSIS — Z981 Arthrodesis status: Secondary | ICD-10-CM | POA: Diagnosis not present

## 2020-09-11 DIAGNOSIS — E7800 Pure hypercholesterolemia, unspecified: Secondary | ICD-10-CM | POA: Diagnosis present

## 2020-09-11 DIAGNOSIS — M79605 Pain in left leg: Secondary | ICD-10-CM | POA: Diagnosis present

## 2020-09-11 DIAGNOSIS — I10 Essential (primary) hypertension: Secondary | ICD-10-CM | POA: Diagnosis present

## 2020-09-11 DIAGNOSIS — M5116 Intervertebral disc disorders with radiculopathy, lumbar region: Secondary | ICD-10-CM | POA: Diagnosis present

## 2020-09-11 DIAGNOSIS — N400 Enlarged prostate without lower urinary tract symptoms: Secondary | ICD-10-CM | POA: Diagnosis present

## 2020-09-11 LAB — GLUCOSE, CAPILLARY: Glucose-Capillary: 206 mg/dL — ABNORMAL HIGH (ref 70–99)

## 2020-09-11 MED ORDER — HYDROCODONE-ACETAMINOPHEN 7.5-325 MG PO TABS: 1.0000 | ORAL_TABLET | Freq: Four times a day (QID) | ORAL | 0 refills | Status: DC

## 2020-09-11 NOTE — Evaluation (Signed)
Occupational Therapy Evaluation Patient Details Name: Peter Aguilar MRN: [id number] DOB: [birth year] Today's Date: 09/11/2020    History of Present Illness 80 y.o. male presenting with L3-4, L4-5 and L5-S1 lumbar stenosis with disc herniation at L5-S1 s/p hemilaminectomy, medial facetectomy, and foraminotomy with discectomy L5-S1 on the left. PMHx significant for ACDF ~2007, previous back surgery 04/2019, DM, Gilbert syndrome & HTN.   Clinical Impression   PTA patient was living with his spouse in a single-level private residence and was independent with ADLs/IADLs without AD. Patient reports recent driving cessation and intermittent use of SPC vs. crutches 2/2 pain. Patient currently presents near presumed baseline level of function demonstrating observed ADLs including UB/LB dressing and ADL transfers with supervision A for safety 2/2 mild balance deficits without AD. With questioning, patient reports mobility is near his baseline. OT provided education on spinal precautions, home set-up to maximize safety and independence with self-care tasks, and acquisition/use of AE. Patient expressed verbal understanding but would benefit from continued education. Patient's wife present at bedside was an active part of education session and is able to recall 3/3 back precautions. OT will continue to follow acutely to maximize safety and independence with self-care tasks in prep for return home with wife.     Follow Up Recommendations  No OT follow up    Equipment Recommendations  None recommended by OT    Recommendations for Other Services       Precautions / Restrictions Precautions Precautions: Back Precaution Booklet Issued: Yes (comment) Precaution Comments: Reviewed written handout. Patient able to recall 2/3 back precautions at conclusion of session. Wife present at bedside able to recall 3/3 precautions. Required Braces or Orthoses:  (No brace needed.) Restrictions Weight Bearing  Restrictions: No      Mobility Bed Mobility Overal bed mobility: Needs Assistance Bed Mobility: Rolling;Sidelying to Sit Rolling: Supervision Sidelying to sit: Supervision       General bed mobility comments: Supervision A with cues for log rolling technique +rail.    Transfers Overall transfer level: Needs assistance Equipment used: None Transfers: Sit to/from Stand Sit to Stand: Supervision         General transfer comment: Supervision A for safety.    Balance Overall balance assessment: Mild deficits observed, not formally tested                                         ADL either performed or assessed with clinical judgement   ADL Overall ADL's : Needs assistance/impaired                 Upper Body Dressing : Supervision/safety;Sitting   Lower Body Dressing: Supervision/safety;Sit to/from stand Lower Body Dressing Details (indicate cue type and reason): Able to thread BLE in figure-4 position. Supervision A to hike pants over hips in standing 2/2 mild balance deficits. Toilet Transfer: Radiographer, therapeutic Details (indicate cue type and reason): Simulated with transfer to recliner.         Functional mobility during ADLs: Supervision/safety (Short-distances in room.)       Vision Baseline Vision/History: Wears glasses Wears Glasses: At all times Patient Visual Report: No change from baseline       Perception     Praxis      Pertinent Vitals/Pain Pain Assessment: 0-10 Pain Score: 0-No pain Pain Intervention(s): Monitored during session     Hand Dominance Right   Extremity/Trunk Assessment  Upper Extremity Assessment Upper Extremity Assessment: Overall WFL for tasks assessed   Lower Extremity Assessment Lower Extremity Assessment: Defer to PT evaluation   Cervical / Trunk Assessment Cervical / Trunk Assessment: Other exceptions Cervical / Trunk Exceptions: s/p spinal surgery.   Communication  Communication Communication: HOH (Speak into L ear.)   Cognition Arousal/Alertness: Awake/alert Behavior During Therapy: WFL for tasks assessed/performed Overall Cognitive Status: Impaired/Different from baseline Area of Impairment: Memory                     Memory: Decreased short-term memory         General Comments: Patient able to recall 2/3 back precautions after education.   General Comments  Clean, dry dressing at incision. Wife present at bedside for education session.    Exercises     Shoulder Instructions      Home Living Family/patient expects to be discharged to:: Private residence Living Arrangements: Spouse/significant other Available Help at Discharge: Family;Available 24 hours/day Type of Home: House Home Access: Stairs to enter [employer] of Steps: 3-4 at front and back Entrance Stairs-Rails: Right;Left;Can reach both Home Layout: One level     Bathroom Shower/Tub: Producer, television/film/video: Standard     Home Equipment: Cane - single point;Crutches;Shower seat          Prior Functioning/Environment Level of Independence: Independent        Comments: Independent with ADLs/IADLs including driving. Patient reports intermittent use of SPC or crutches more recently 2/2 back pain.        OT Problem List: Impaired balance (sitting and/or standing)      OT Treatment/Interventions:      OT Goals(Current goals can be found in the care plan section) Acute Rehab OT Goals Patient Stated Goal: To return home. OT Goal Formulation: With patient Time For Goal Achievement: 09/25/20 Potential to Achieve Goals: Good ADL Goals Additional ADL Goal #1: Patient will recall 3/3 back precautions without cueing in prep for ADLs. Additional ADL Goal #2: Patient will complete A.M. ADLs with Mod I and use of LRAD with good adherence to back precautions.  OT Frequency:     Barriers to D/C:            Co-evaluation               AM-PAC OT "6 Clicks" Daily Activity     Outcome Measure Help from another person eating meals?: None Help from another person taking care of personal grooming?: None Help from another person toileting, which includes using toliet, bedpan, or urinal?: A Little Help from another person bathing (including washing, rinsing, drying)?: A Little Help from another person to put on and taking off regular upper body clothing?: A Little Help from another person to put on and taking off regular lower body clothing?: A Little 6 Click Score: 20   End of Session Nurse Communication: Mobility status  Activity Tolerance: Patient tolerated treatment well Patient left: in chair;with call bell/phone within reach;with family/visitor present  OT Visit Diagnosis: Unsteadiness on feet (R26.81)                Time: 8937-3428 OT Time Calculation (min): 15 min Charges:  OT General Charges $OT Visit: 1 Visit OT Evaluation $OT Eval Low Complexity: 1 Low  Verdene Creson H. OTR/L Supplemental OT, Department of rehab services [phone]  Willford Rabideau R H. 09/11/2020, 9:26 AM

## 2020-09-11 NOTE — Discharge Summary (Signed)
Physician Discharge Summary  Patient ID: Peter Aguilar MRN: [id number] DOB/AGE: 14-Dec-1940 80 y.o.  Admit date: 09/10/2020 Discharge date: 09/11/2020  Admission Diagnoses: lumbar stenosis/ radiculopathy    Discharge Diagnoses: same   Discharged Condition: good  Hospital Course: The patient was admitted on 09/10/2020 and taken to the operating room where the patient underwent LL L3-4 l4-5 l5-S1. The patient tolerated the procedure well and was taken to the recovery room and then to the floor in stable condition. The hospital course was routine. There were no complications. The wound remained clean dry and intact. Pt had appropriate back soreness. No complaints of leg pain or new N/T/W. The patient remained afebrile with stable vital signs, and tolerated a regular diet. The patient continued to increase activities, and pain was well controlled with oral pain medications.   Consults: None  Significant Diagnostic Studies:  Results for orders placed or performed during the hospital encounter of 09/10/20  Hepatic function panel  Result Value Ref Range   Total Protein 6.7 6.5 - 8.1 g/dL   Albumin 4.1 3.5 - 5.0 g/dL   AST 27 15 - 41 U/L   ALT 40 0 - 44 U/L   Alkaline Phosphatase 40 38 - 126 U/L   Total Bilirubin 1.0 0.3 - 1.2 mg/dL   Bilirubin, Direct 0.2 0.0 - 0.2 mg/dL   Indirect Bilirubin 0.8 0.3 - 0.9 mg/dL  Glucose, capillary  Result Value Ref Range   Glucose-Capillary 177 (H) 70 - 99 mg/dL  Glucose, capillary  Result Value Ref Range   Glucose-Capillary 177 (H) 70 - 99 mg/dL  Glucose, capillary  Result Value Ref Range   Glucose-Capillary 250 (H) 70 - 99 mg/dL   Comment 1 Notify RN    Comment 2 Document in Chart   Glucose, capillary  Result Value Ref Range   Glucose-Capillary 206 (H) 70 - 99 mg/dL   Comment 1 Notify RN    Comment 2 Document in Chart     Chest 2 View  Result Date: 09/05/2020 CLINICAL DATA:  80 year old male with a history of back surgery EXAM: CHEST - 2  VIEW COMPARISON:  06/27/2015 FINDINGS: Cardiomediastinal silhouette unchanged in size and contour. No evidence of central vascular congestion. No interlobular septal thickening. No pneumothorax or pleural effusion. No confluent airspace disease. No acute displaced fracture. Degenerative changes of the thoracic spine. IMPRESSION: Negative for acute cardiopulmonary disease Electronically Signed   By: Gilmer Mor D.O.   On: 09/05/2020 14:59   DG Lumbar Spine 1 View  Result Date: 09/10/2020 CLINICAL DATA:  Localization for L3-S1 laminectomy. EXAM: LUMBAR SPINE - 1 VIEW COMPARISON:  04/14/2019, 08/14/2020 FINDINGS: Surgical instrument is identified along the posterior aspect of the lumbar spine. A surgical instrument tip is posterior to the disc space at L4-5 IMPRESSION: Intraoperative localization. Electronically Signed   By: Norva Pavlov M.D.   On: 09/10/2020 16:58    Antibiotics:  Anti-infectives (From admission, onward)   Start     Dose/Rate Route Frequency Ordered Stop   09/11/20 0000  ceFAZolin (ANCEF) IVPB 2g/100 mL premix        2 g 200 mL/hr over 30 Minutes Intravenous Every 8 hours 09/10/20 1943 09/11/20 0710   09/10/20 0945  ceFAZolin (ANCEF) IVPB 2g/100 mL premix        2 g 200 mL/hr over 30 Minutes Intravenous On call to O.R. 09/10/20 0932 09/10/20 1655      Discharge Exam: Blood pressure 121/69, pulse 79, temperature 97.8 F (36.6 C), temperature  source Oral, resp. rate 18, height 6' (1.829 m), weight 83.9 kg, SpO2 96 %. Neurologic: Alert and oriented X 3, normal strength and tone. Normal symmetric reflexes. Normal coordination and gait Dressing dry  Discharge Medications:   Allergies as of 09/11/2020      Reactions   Metformin And Related Other (See Comments)   Upset stomach   Tetracyclines & Related Rash, Other (See Comments)   Sun exposure caused rash      Medication List    TAKE these medications   amLODipine 5 MG tablet Commonly known as: NORVASC Take 5 mg by  mouth daily.   atorvastatin 20 MG tablet Commonly known as: LIPITOR Take 20 mg by mouth every evening.   citalopram 20 MG tablet Commonly known as: CELEXA Take 20 mg by mouth every evening.   CoQ-10 100 MG Caps Take 100 mg by mouth daily.   glimepiride 2 MG tablet Commonly known as: AMARYL Take 2 mg by mouth daily with supper.   HYDROcodone-acetaminophen 7.5-325 MG tablet Commonly known as: NORCO Take 1 tablet by mouth every 6 (six) hours.   ibuprofen 200 MG tablet Commonly known as: ADVIL Take 600 mg by mouth at bedtime.   Januvia 50 MG tablet Generic drug: sitaGLIPtin Take 50 mg by mouth daily.   levothyroxine 100 MCG tablet Commonly known as: SYNTHROID Take 100 mcg by mouth daily.   multivitamin with minerals Tabs tablet Take 1 tablet by mouth daily.   quinapril-hydrochlorothiazide 20-12.5 MG tablet Commonly known as: ACCURETIC Take 1 tablet by mouth in the morning and at bedtime.   Vitamin D3 125 MCG (5000 UT) Tabs Take 5,000 Units by mouth daily.       Disposition: home   Final Dx: LL for stenosis  Discharge Instructions     Remove dressing in 72 hours   Complete by: As directed    Call MD for:  difficulty breathing, headache or visual disturbances   Complete by: As directed    Call MD for:  persistant nausea and vomiting   Complete by: As directed    Call MD for:  redness, tenderness, or signs of infection (pain, swelling, redness, odor or green/yellow discharge around incision site)   Complete by: As directed    Call MD for:  severe uncontrolled pain   Complete by: As directed    Call MD for:  temperature >100.4   Complete by: As directed    Diet - low sodium heart healthy   Complete by: As directed    Increase activity slowly   Complete by: As directed        Follow-up Information    Tia Alert, MD. Schedule an appointment as soon as possible for a visit in 2 week(s).   Specialty: Neurosurgery Contact information: [address] [place] [address] [phone]                Signed: Tia Alert 09/11/2020, 7:43 AM

## 2020-09-11 NOTE — Anesthesia Postprocedure Evaluation (Signed)
Anesthesia Post Note  Patient: Peter Aguilar  Procedure(s) Performed: Laminectomy and Foraminotomy - left - Lumbar three-Lumbar four - Lumbar four-Lumbar five - Lumbar five-Sacral one (Left Back)     Anesthesia Post Evaluation No complications documented.  Last Vitals:  Vitals:   09/11/20 0411 09/11/20 0747  BP: 121/69 116/62  Pulse: 79 75  Resp: 18 17  Temp: 36.6 C 36.4 C  SpO2: 96% 96%    Last Pain:  Vitals:   09/11/20 0747  TempSrc: Oral  PainSc:                  [name]

## 2020-09-11 NOTE — Plan of Care (Signed)
Patient alert and oriented, voiding adequately, MAE well with no difficulty. Incision area cdi with no s/s of infection. Patient discharged home per order. Patient and husband stated understanding of discharge instructions given. Patient has an appointment with Dr. Jones 

## 2020-09-12 MED FILL — Thrombin (Recombinant) For Soln 5000 Unit: CUTANEOUS | Qty: 5000 | Status: AC

## 2020-10-09 DIAGNOSIS — N182 Chronic kidney disease, stage 2 (mild): Secondary | ICD-10-CM | POA: Diagnosis not present

## 2020-10-09 DIAGNOSIS — E7800 Pure hypercholesterolemia, unspecified: Secondary | ICD-10-CM | POA: Diagnosis not present

## 2020-10-09 DIAGNOSIS — I10 Essential (primary) hypertension: Secondary | ICD-10-CM | POA: Diagnosis not present

## 2020-10-09 DIAGNOSIS — R69 Illness, unspecified: Secondary | ICD-10-CM | POA: Diagnosis not present

## 2020-10-09 DIAGNOSIS — E1165 Type 2 diabetes mellitus with hyperglycemia: Secondary | ICD-10-CM | POA: Diagnosis not present

## 2020-10-09 DIAGNOSIS — E781 Pure hyperglyceridemia: Secondary | ICD-10-CM | POA: Diagnosis not present

## 2020-10-09 DIAGNOSIS — E782 Mixed hyperlipidemia: Secondary | ICD-10-CM | POA: Diagnosis not present

## 2020-10-09 DIAGNOSIS — E039 Hypothyroidism, unspecified: Secondary | ICD-10-CM | POA: Diagnosis not present

## 2020-10-09 DIAGNOSIS — E1122 Type 2 diabetes mellitus with diabetic chronic kidney disease: Secondary | ICD-10-CM | POA: Diagnosis not present

## 2020-10-09 DIAGNOSIS — N400 Enlarged prostate without lower urinary tract symptoms: Secondary | ICD-10-CM | POA: Diagnosis not present

## 2020-11-08 DIAGNOSIS — M48061 Spinal stenosis, lumbar region without neurogenic claudication: Secondary | ICD-10-CM | POA: Diagnosis not present

## 2020-11-08 DIAGNOSIS — M5126 Other intervertebral disc displacement, lumbar region: Secondary | ICD-10-CM | POA: Diagnosis not present

## 2020-11-08 DIAGNOSIS — M5416 Radiculopathy, lumbar region: Secondary | ICD-10-CM | POA: Diagnosis not present

## 2020-11-08 DIAGNOSIS — M47816 Spondylosis without myelopathy or radiculopathy, lumbar region: Secondary | ICD-10-CM | POA: Diagnosis not present

## 2020-11-13 DIAGNOSIS — M5126 Other intervertebral disc displacement, lumbar region: Secondary | ICD-10-CM | POA: Diagnosis not present

## 2020-11-13 DIAGNOSIS — I10 Essential (primary) hypertension: Secondary | ICD-10-CM | POA: Diagnosis not present

## 2020-11-13 DIAGNOSIS — Z6825 Body mass index (BMI) 25.0-25.9, adult: Secondary | ICD-10-CM | POA: Diagnosis not present

## 2020-11-15 DIAGNOSIS — R69 Illness, unspecified: Secondary | ICD-10-CM | POA: Diagnosis not present

## 2020-11-15 DIAGNOSIS — E782 Mixed hyperlipidemia: Secondary | ICD-10-CM | POA: Diagnosis not present

## 2020-11-15 DIAGNOSIS — E039 Hypothyroidism, unspecified: Secondary | ICD-10-CM | POA: Diagnosis not present

## 2020-11-15 DIAGNOSIS — E1165 Type 2 diabetes mellitus with hyperglycemia: Secondary | ICD-10-CM | POA: Diagnosis not present

## 2020-11-15 DIAGNOSIS — N182 Chronic kidney disease, stage 2 (mild): Secondary | ICD-10-CM | POA: Diagnosis not present

## 2020-11-15 DIAGNOSIS — E781 Pure hyperglyceridemia: Secondary | ICD-10-CM | POA: Diagnosis not present

## 2020-11-15 DIAGNOSIS — E7800 Pure hypercholesterolemia, unspecified: Secondary | ICD-10-CM | POA: Diagnosis not present

## 2020-11-15 DIAGNOSIS — I10 Essential (primary) hypertension: Secondary | ICD-10-CM | POA: Diagnosis not present

## 2020-11-15 DIAGNOSIS — N400 Enlarged prostate without lower urinary tract symptoms: Secondary | ICD-10-CM | POA: Diagnosis not present

## 2020-11-15 DIAGNOSIS — E1122 Type 2 diabetes mellitus with diabetic chronic kidney disease: Secondary | ICD-10-CM | POA: Diagnosis not present

## 2020-11-21 DIAGNOSIS — M5126 Other intervertebral disc displacement, lumbar region: Secondary | ICD-10-CM | POA: Diagnosis not present

## 2020-11-21 DIAGNOSIS — M5117 Intervertebral disc disorders with radiculopathy, lumbosacral region: Secondary | ICD-10-CM | POA: Diagnosis not present

## 2020-11-21 DIAGNOSIS — Z9889 Other specified postprocedural states: Secondary | ICD-10-CM | POA: Diagnosis not present

## 2021-01-18 DIAGNOSIS — N400 Enlarged prostate without lower urinary tract symptoms: Secondary | ICD-10-CM | POA: Diagnosis not present

## 2021-01-18 DIAGNOSIS — E1165 Type 2 diabetes mellitus with hyperglycemia: Secondary | ICD-10-CM | POA: Diagnosis not present

## 2021-01-18 DIAGNOSIS — R69 Illness, unspecified: Secondary | ICD-10-CM | POA: Diagnosis not present

## 2021-01-18 DIAGNOSIS — E781 Pure hyperglyceridemia: Secondary | ICD-10-CM | POA: Diagnosis not present

## 2021-01-18 DIAGNOSIS — E782 Mixed hyperlipidemia: Secondary | ICD-10-CM | POA: Diagnosis not present

## 2021-01-18 DIAGNOSIS — N182 Chronic kidney disease, stage 2 (mild): Secondary | ICD-10-CM | POA: Diagnosis not present

## 2021-01-18 DIAGNOSIS — I10 Essential (primary) hypertension: Secondary | ICD-10-CM | POA: Diagnosis not present

## 2021-01-18 DIAGNOSIS — E1122 Type 2 diabetes mellitus with diabetic chronic kidney disease: Secondary | ICD-10-CM | POA: Diagnosis not present

## 2021-01-18 DIAGNOSIS — E7800 Pure hypercholesterolemia, unspecified: Secondary | ICD-10-CM | POA: Diagnosis not present

## 2021-01-18 DIAGNOSIS — E039 Hypothyroidism, unspecified: Secondary | ICD-10-CM | POA: Diagnosis not present

## 2021-03-27 DIAGNOSIS — R69 Illness, unspecified: Secondary | ICD-10-CM | POA: Diagnosis not present

## 2021-03-27 DIAGNOSIS — R5383 Other fatigue: Secondary | ICD-10-CM | POA: Diagnosis not present

## 2021-03-27 DIAGNOSIS — I700 Atherosclerosis of aorta: Secondary | ICD-10-CM | POA: Diagnosis not present

## 2021-03-27 DIAGNOSIS — N400 Enlarged prostate without lower urinary tract symptoms: Secondary | ICD-10-CM | POA: Diagnosis not present

## 2021-03-27 DIAGNOSIS — E039 Hypothyroidism, unspecified: Secondary | ICD-10-CM | POA: Diagnosis not present

## 2021-03-27 DIAGNOSIS — Z23 Encounter for immunization: Secondary | ICD-10-CM | POA: Diagnosis not present

## 2021-03-27 DIAGNOSIS — I10 Essential (primary) hypertension: Secondary | ICD-10-CM | POA: Diagnosis not present

## 2021-03-27 DIAGNOSIS — N183 Chronic kidney disease, stage 3 unspecified: Secondary | ICD-10-CM | POA: Diagnosis not present

## 2021-03-27 DIAGNOSIS — E782 Mixed hyperlipidemia: Secondary | ICD-10-CM | POA: Diagnosis not present

## 2021-03-27 DIAGNOSIS — E1122 Type 2 diabetes mellitus with diabetic chronic kidney disease: Secondary | ICD-10-CM | POA: Diagnosis not present

## 2021-04-25 DIAGNOSIS — E1136 Type 2 diabetes mellitus with diabetic cataract: Secondary | ICD-10-CM | POA: Diagnosis not present

## 2021-04-25 DIAGNOSIS — Z7982 Long term (current) use of aspirin: Secondary | ICD-10-CM | POA: Diagnosis not present

## 2021-04-25 DIAGNOSIS — M4800 Spinal stenosis, site unspecified: Secondary | ICD-10-CM | POA: Diagnosis not present

## 2021-04-25 DIAGNOSIS — R69 Illness, unspecified: Secondary | ICD-10-CM | POA: Diagnosis not present

## 2021-04-25 DIAGNOSIS — F329 Major depressive disorder, single episode, unspecified: Secondary | ICD-10-CM | POA: Diagnosis not present

## 2021-04-25 DIAGNOSIS — M1990 Unspecified osteoarthritis, unspecified site: Secondary | ICD-10-CM | POA: Diagnosis not present

## 2021-04-25 DIAGNOSIS — E890 Postprocedural hypothyroidism: Secondary | ICD-10-CM | POA: Diagnosis not present

## 2021-04-25 DIAGNOSIS — K219 Gastro-esophageal reflux disease without esophagitis: Secondary | ICD-10-CM | POA: Diagnosis not present

## 2021-04-25 DIAGNOSIS — G8929 Other chronic pain: Secondary | ICD-10-CM | POA: Diagnosis not present

## 2021-04-25 DIAGNOSIS — R32 Unspecified urinary incontinence: Secondary | ICD-10-CM | POA: Diagnosis not present

## 2021-04-25 DIAGNOSIS — E785 Hyperlipidemia, unspecified: Secondary | ICD-10-CM | POA: Diagnosis not present

## 2021-04-25 DIAGNOSIS — Z7984 Long term (current) use of oral hypoglycemic drugs: Secondary | ICD-10-CM | POA: Diagnosis not present

## 2021-04-25 DIAGNOSIS — I10 Essential (primary) hypertension: Secondary | ICD-10-CM | POA: Diagnosis not present

## 2021-05-28 DIAGNOSIS — H2513 Age-related nuclear cataract, bilateral: Secondary | ICD-10-CM | POA: Diagnosis not present

## 2021-05-28 DIAGNOSIS — H25813 Combined forms of age-related cataract, bilateral: Secondary | ICD-10-CM | POA: Diagnosis not present

## 2021-05-28 DIAGNOSIS — E119 Type 2 diabetes mellitus without complications: Secondary | ICD-10-CM | POA: Diagnosis not present

## 2021-05-28 DIAGNOSIS — H524 Presbyopia: Secondary | ICD-10-CM | POA: Diagnosis not present

## 2021-05-28 DIAGNOSIS — H52221 Regular astigmatism, right eye: Secondary | ICD-10-CM | POA: Diagnosis not present

## 2021-05-28 DIAGNOSIS — H5203 Hypermetropia, bilateral: Secondary | ICD-10-CM | POA: Diagnosis not present

## 2021-07-30 DIAGNOSIS — H2512 Age-related nuclear cataract, left eye: Secondary | ICD-10-CM | POA: Diagnosis not present

## 2021-07-30 DIAGNOSIS — H5005 Alternating esotropia: Secondary | ICD-10-CM | POA: Diagnosis not present

## 2021-07-30 DIAGNOSIS — H25013 Cortical age-related cataract, bilateral: Secondary | ICD-10-CM | POA: Diagnosis not present

## 2021-07-30 DIAGNOSIS — H25043 Posterior subcapsular polar age-related cataract, bilateral: Secondary | ICD-10-CM | POA: Diagnosis not present

## 2021-07-30 DIAGNOSIS — H2513 Age-related nuclear cataract, bilateral: Secondary | ICD-10-CM | POA: Diagnosis not present

## 2021-07-31 DIAGNOSIS — I10 Essential (primary) hypertension: Secondary | ICD-10-CM | POA: Diagnosis not present

## 2021-07-31 DIAGNOSIS — E1122 Type 2 diabetes mellitus with diabetic chronic kidney disease: Secondary | ICD-10-CM | POA: Diagnosis not present

## 2021-07-31 DIAGNOSIS — Z23 Encounter for immunization: Secondary | ICD-10-CM | POA: Diagnosis not present

## 2021-07-31 DIAGNOSIS — E039 Hypothyroidism, unspecified: Secondary | ICD-10-CM | POA: Diagnosis not present

## 2021-07-31 DIAGNOSIS — E213 Hyperparathyroidism, unspecified: Secondary | ICD-10-CM | POA: Diagnosis not present

## 2021-07-31 DIAGNOSIS — R69 Illness, unspecified: Secondary | ICD-10-CM | POA: Diagnosis not present

## 2021-07-31 DIAGNOSIS — E782 Mixed hyperlipidemia: Secondary | ICD-10-CM | POA: Diagnosis not present

## 2021-07-31 DIAGNOSIS — I700 Atherosclerosis of aorta: Secondary | ICD-10-CM | POA: Diagnosis not present

## 2021-07-31 DIAGNOSIS — N183 Chronic kidney disease, stage 3 unspecified: Secondary | ICD-10-CM | POA: Diagnosis not present

## 2021-09-30 DIAGNOSIS — H2512 Age-related nuclear cataract, left eye: Secondary | ICD-10-CM | POA: Diagnosis not present

## 2021-10-01 DIAGNOSIS — H2511 Age-related nuclear cataract, right eye: Secondary | ICD-10-CM | POA: Diagnosis not present

## 2021-10-12 HISTORY — PX: EYE SURGERY: SHX253

## 2021-10-14 DIAGNOSIS — H2511 Age-related nuclear cataract, right eye: Secondary | ICD-10-CM | POA: Diagnosis not present

## 2022-03-18 IMAGING — CR DG CHEST 2V
2 series · 2 of 2 positions shown · non-contrast
Comparison: 06/27/2015

CLINICAL DATA: 79-year-old male with a history of back surgery

EXAM:
CHEST - 2 VIEW

[w chest pa]
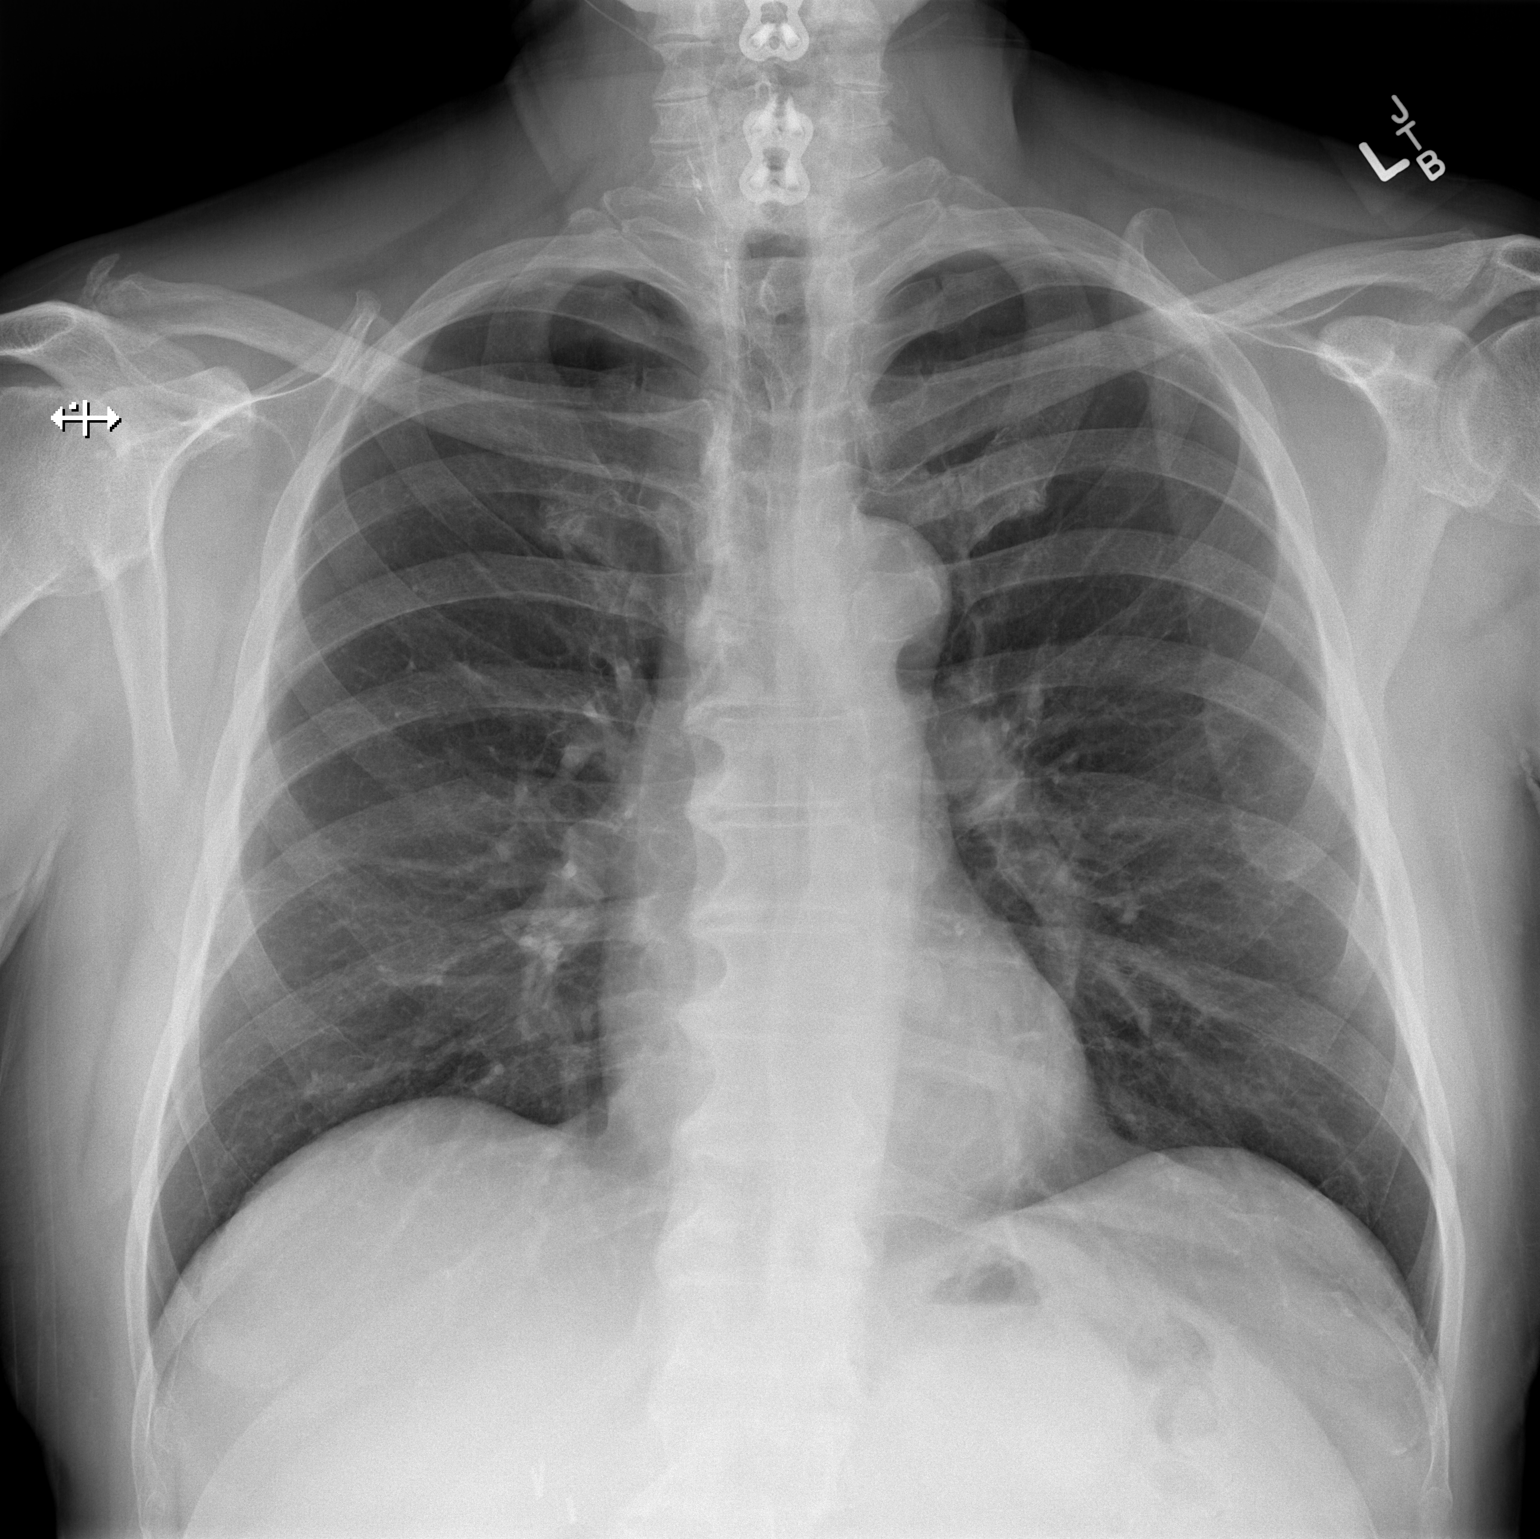

[w chest lat]
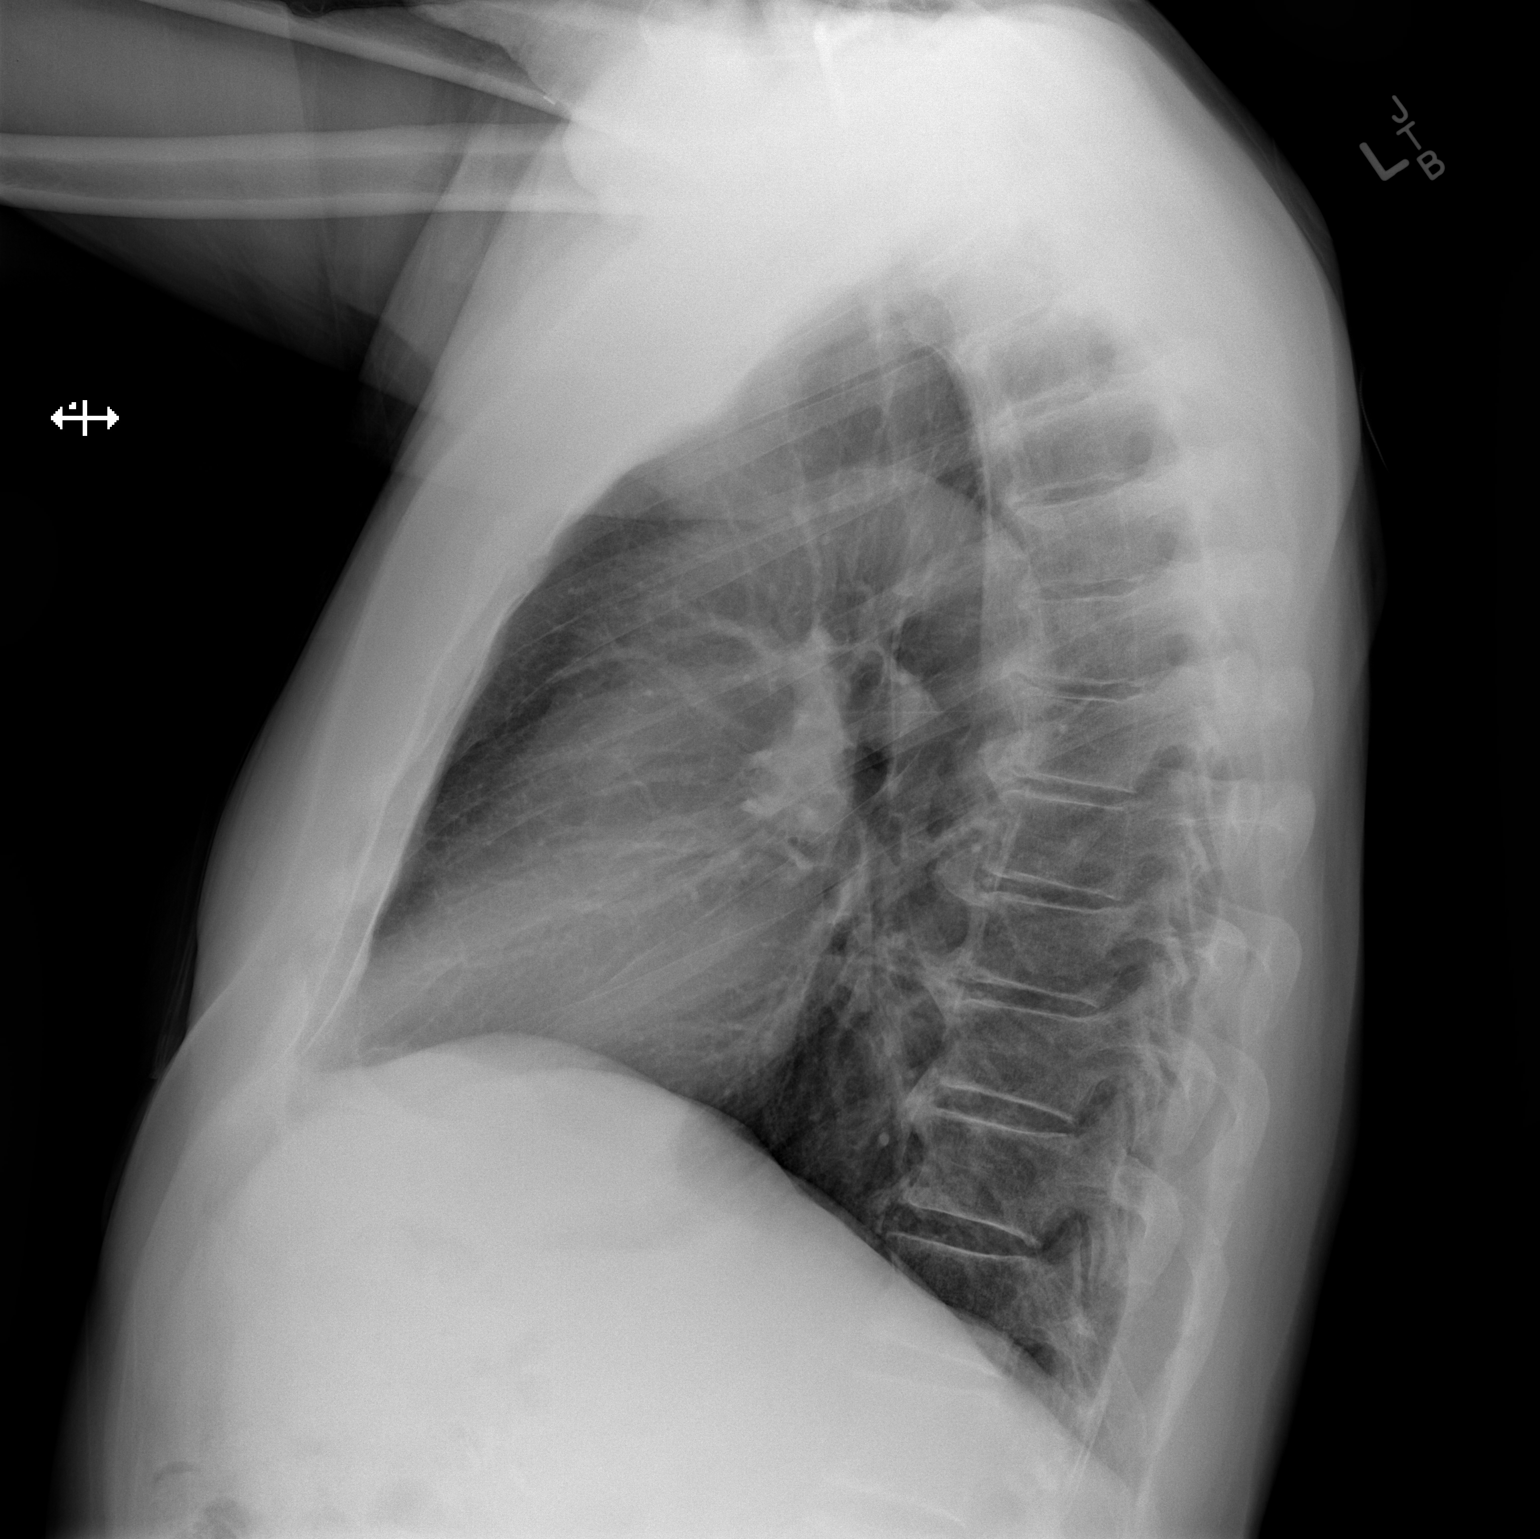

[2 of 2 positions shown; findings below may reference images not displayed]

FINDINGS: Cardiomediastinal silhouette unchanged in size and contour. No
evidence of central vascular congestion. No interlobular septal
thickening. No pneumothorax or pleural effusion. No confluent
airspace disease. No acute displaced fracture. Degenerative changes
of the thoracic spine.
IMPRESSION: Negative for acute cardiopulmonary disease

## 2022-03-24 IMAGING — CR DG LUMBAR SPINE 1V
1 series · 1 of 1 positions shown · non-contrast
Comparison: 04/14/2019, 08/14/2020

CLINICAL DATA: Localization for L3-S1 laminectomy.

EXAM:
LUMBAR SPINE - 1 VIEW

[xtable lateral]
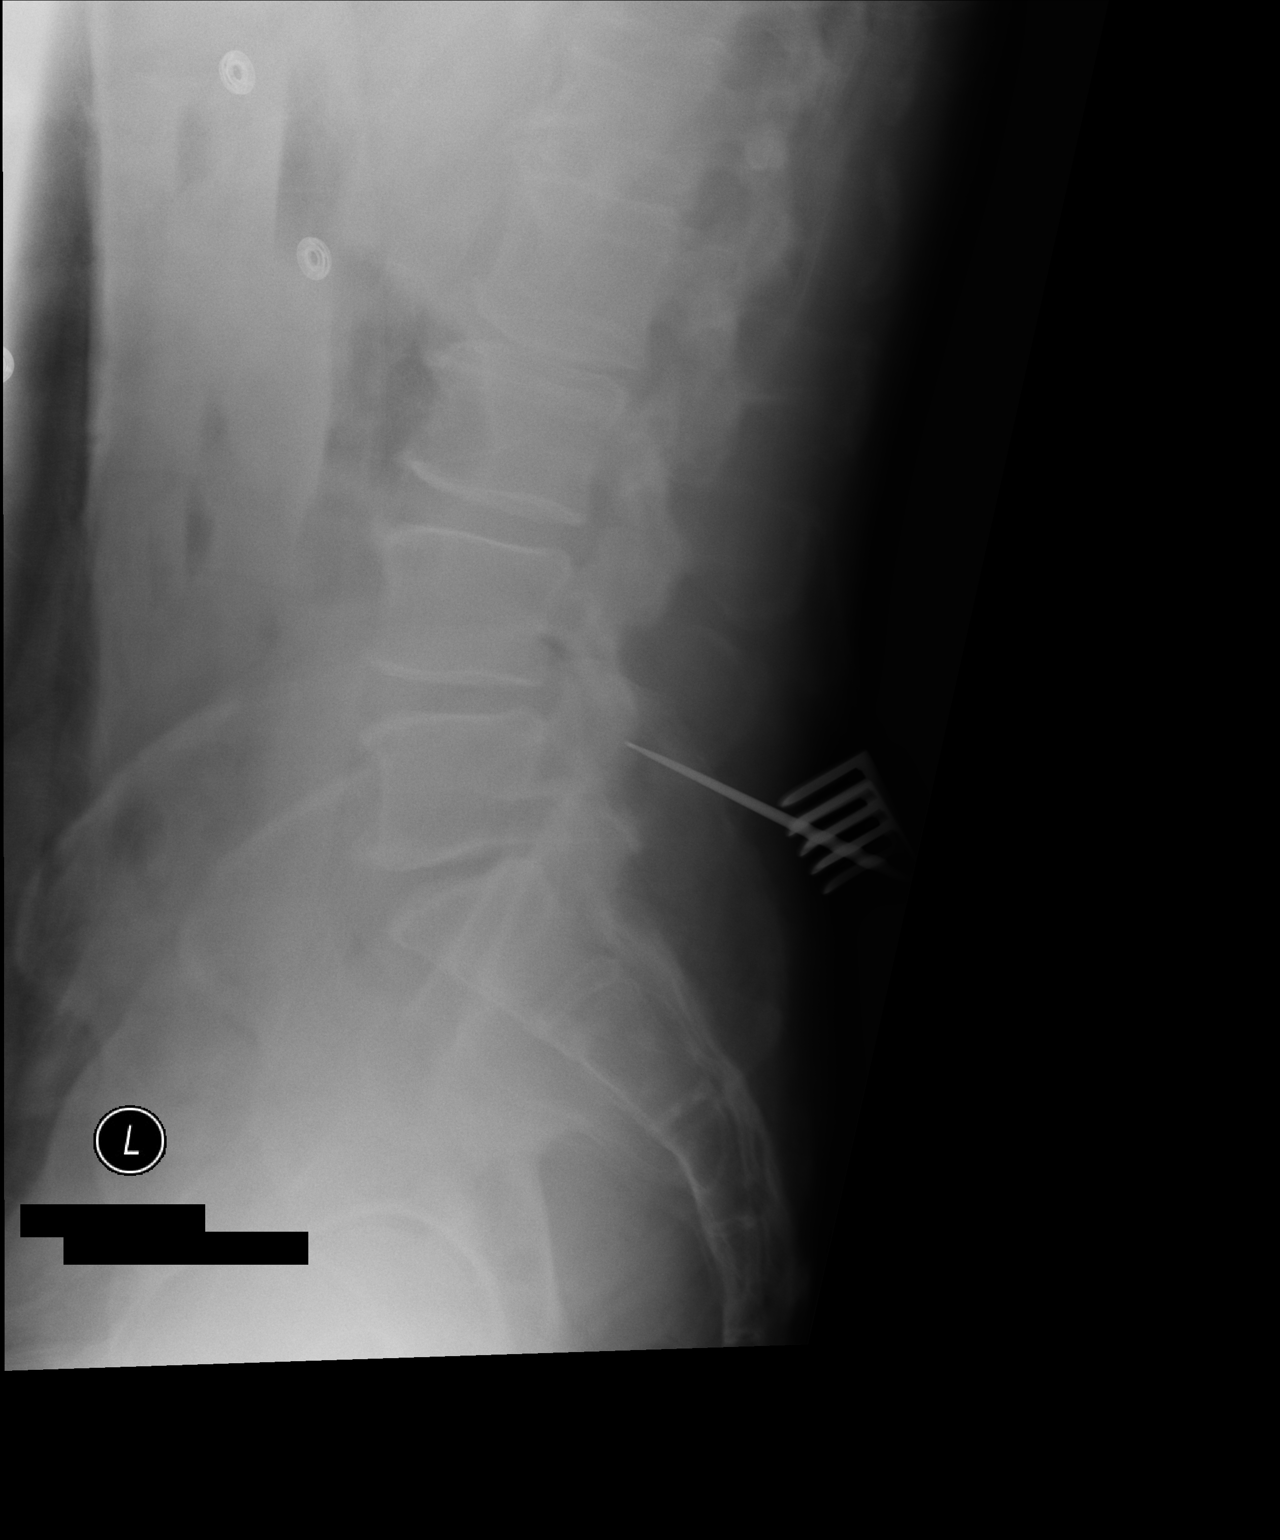

[1 of 1 positions shown; findings below may reference images not displayed]

FINDINGS: Surgical instrument is identified along the posterior aspect of the
lumbar spine. A surgical instrument tip is posterior to the disc
space at L4-5
IMPRESSION: Intraoperative localization.

## 2022-03-26 DIAGNOSIS — M5417 Radiculopathy, lumbosacral region: Secondary | ICD-10-CM | POA: Diagnosis not present

## 2022-03-26 DIAGNOSIS — M5127 Other intervertebral disc displacement, lumbosacral region: Secondary | ICD-10-CM | POA: Diagnosis not present

## 2022-03-26 DIAGNOSIS — Z6825 Body mass index (BMI) 25.0-25.9, adult: Secondary | ICD-10-CM | POA: Diagnosis not present

## 2022-04-02 DIAGNOSIS — M545 Low back pain, unspecified: Secondary | ICD-10-CM | POA: Diagnosis not present

## 2022-04-02 DIAGNOSIS — M5417 Radiculopathy, lumbosacral region: Secondary | ICD-10-CM | POA: Diagnosis not present

## 2022-04-15 DIAGNOSIS — M5417 Radiculopathy, lumbosacral region: Secondary | ICD-10-CM | POA: Diagnosis not present

## 2022-04-15 DIAGNOSIS — M5127 Other intervertebral disc displacement, lumbosacral region: Secondary | ICD-10-CM | POA: Diagnosis not present

## 2022-04-21 ENCOUNTER — Other Ambulatory Visit: Payer: Self-pay | Admitting: Neurological Surgery

## 2022-05-07 ENCOUNTER — Other Ambulatory Visit (HOSPITAL_COMMUNITY): Payer: Medicare HMO

## 2022-05-07 ENCOUNTER — Encounter (HOSPITAL_COMMUNITY): Payer: Self-pay

## 2022-05-07 NOTE — Progress Notes (Signed)
Surgical Instructions    Your procedure is scheduled on Wednesday November 1.  Report to [facility] Main Entrance "A" at 1015 A.M., then check in with the Admitting office.  Call this number if you have problems the morning of surgery:  [phone]   If you have any questions prior to your surgery date call [phone]: Open Monday-Friday 8am-4pm If you experience any cold or flu symptoms such as cough, fever, chills, shortness of breath, etc. between now and your scheduled surgery, please notify us at the above number     Remember:  Do not eat or drink anything after midnight the night before your surgery    Take these medicines the morning of surgery with A SIP OF WATER:  amLODipine (NORVASC)  levothyroxine (SYNTHROID)  Follow your surgeon's instructions for when to stop taking aspirin. If no instructions were given, you must call your surgeon for instructions immediately.   As of today, stop taking any Aleve, Naproxen, Ibuprofen, Motrin, Advil, Goody's, BC's, all herbal medications, fish oil, and all vitamins.  WHAT DO I DO ABOUT MY DIABETES MEDICATION?  Do not take evening dose of glimepiride (AMARYL) the night before surgery.        Do not take JANUVIA the morning of surgery.   HOW TO MANAGE YOUR DIABETES BEFORE AND AFTER SURGERY  Why is it important to control my blood sugar before and after surgery? Improving blood sugar levels before and after surgery helps healing and can limit problems. A way of improving blood sugar control is eating a healthy diet by:  Eating less sugar and carbohydrates  Increasing activity/exercise  Talking with your doctor about reaching your blood sugar goals High blood sugars (greater than 180 mg/dL) can raise your risk of infections and slow your recovery, so you will need to focus on controlling your diabetes during the weeks before surgery. Make sure that the doctor who takes care of your diabetes knows about your planned surgery including  the date and location.  How do I manage my blood sugar before surgery? Check your blood sugar at least 4 times a day, starting 2 days before surgery, to make sure that the level is not too high or low.  Check your blood sugar the morning of your surgery when you wake up and every 2 hours until you get to the Short Stay unit.  If your blood sugar is less than 70 mg/dL, you will need to treat for low blood sugar: Do not take insulin. Treat a low blood sugar (less than 70 mg/dL) with  cup of clear juice (cranberry or apple), 4 glucose tablets, OR glucose gel. Recheck blood sugar in 15 minutes after treatment (to make sure it is greater than 70 mg/dL). If your blood sugar is not greater than 70 mg/dL on recheck, call [phone] for further instructions. Report your blood sugar to the short stay nurse when you get to Short Stay.  If you are admitted to the hospital after surgery: Your blood sugar will be checked by the staff and you will probably be given insulin after surgery (instead of oral diabetes medicines) to make sure you have good blood sugar levels. The goal for blood sugar control after surgery is 80-180 mg/dL.          Do not wear jewelry or makeup. Do not wear lotions, powders, perfumes/cologne or deodorant. Do not shave 48 hours prior to surgery.  Men may shave face and neck. Do not bring valuables to the hospital. Do  not wear nail polish, gel polish, artificial nails, or any other type of covering on natural nails (fingers and toes) If you have artificial nails or gel coating that need to be removed by a nail salon, please have this removed prior to surgery. Artificial nails or gel coating may interfere with anesthesia's ability to adequately monitor your vital signs.  [facility] is not responsible for any belongings or valuables.    Do NOT Smoke (Tobacco/Vaping)  24 hours prior to your procedure  If you use a CPAP at night, you may bring your mask for your overnight stay.    Contacts, glasses, hearing aids, dentures or partials may not be worn into surgery, please bring cases for these belongings   For patients admitted to the hospital, discharge time will be determined by your treatment team.   Patients discharged the day of surgery will not be allowed to drive home, and someone needs to stay with them for 24 hours.   SURGICAL WAITING ROOM VISITATION Patients having surgery or a procedure may have no more than 2 support people in the waiting area - these visitors may rotate.   Children under the age of 42 must have an adult with them who is not the patient. If the patient needs to stay at the hospital during part of their recovery, the visitor guidelines for inpatient rooms apply. Pre-op nurse will coordinate an appropriate time for 1 support person to accompany patient in pre-op.  This support person may not rotate.   Please refer to [email or web address] for the visitor guidelines for Inpatients (after your surgery is over and you are in a regular room).    Special instructions:    Oral Hygiene is also important to reduce your risk of infection.  Remember - BRUSH YOUR TEETH THE MORNING OF SURGERY WITH YOUR REGULAR TOOTHPASTE   [facility]- Preparing For Surgery  Before surgery, you can play an important role. Because skin is not sterile, your skin needs to be as free of germs as possible. You can reduce the number of germs on your skin by washing with CHG (chlorahexidine gluconate) Soap before surgery.  CHG is an antiseptic cleaner which kills germs and bonds with the skin to continue killing germs even after washing.     Please do not use if you have an allergy to CHG or antibacterial soaps. If your skin becomes reddened/irritated stop using the CHG.  Do not shave (including legs and underarms) for at least 48 hours prior to first CHG shower. It is OK to shave your face.  Please follow these instructions  carefully.     Shower the NIGHT BEFORE SURGERY and the MORNING OF SURGERY with CHG Soap.   If you chose to wash your hair, wash your hair first as usual with your normal shampoo. After you shampoo, rinse your hair and body thoroughly to remove the shampoo.  Then [employer] and genitals (private parts) with your normal soap and rinse thoroughly to remove soap.  After that Use CHG Soap as you would any other liquid soap. You can apply CHG directly to the skin and wash gently with a scrungie or a clean washcloth.   Apply the CHG Soap to your body ONLY FROM THE NECK DOWN.  Do not use on open wounds or open sores. Avoid contact with your eyes, ears, mouth and genitals (private parts). Wash Face and genitals (private parts)  with your normal soap.   Wash thoroughly, paying special attention to the area  where your surgery will be performed.  Thoroughly rinse your body with warm water from the neck down.  DO NOT shower/wash with your normal soap after using and rinsing off the CHG Soap.  Pat yourself dry with a CLEAN TOWEL.  Wear CLEAN PAJAMAS to bed the night before surgery  Place CLEAN SHEETS on your bed the night before your surgery  DO NOT SLEEP WITH PETS.   Day of Surgery:  Take a shower with CHG soap. Wear Clean/Comfortable clothing the morning of surgery Do not apply any deodorants/lotions.   Remember to brush your teeth WITH YOUR REGULAR TOOTHPASTE.    If you received a COVID test during your pre-op visit, it is requested that you wear a mask when out in public, stay away from anyone that may not be feeling well, and notify your surgeon if you develop symptoms. If you have been in contact with anyone that has tested positive in the last 10 days, please notify your surgeon.    Please read over the following fact sheets that you were given.

## 2022-05-08 ENCOUNTER — Encounter (HOSPITAL_COMMUNITY): Payer: Self-pay

## 2022-05-08 ENCOUNTER — Encounter (HOSPITAL_COMMUNITY)
Admission: RE | Admit: 2022-05-08 | Discharge: 2022-05-08 | Disposition: A | Discharge status: Home / Self Care | Payer: Medicare HMO | Source: Ambulatory Visit | Attending: Neurological Surgery | Admitting: Neurological Surgery

## 2022-05-08 ENCOUNTER — Other Ambulatory Visit: Payer: Self-pay

## 2022-05-08 VITALS — BP 124/66 | HR 73 | Temp 97.5°F | Resp 18 | Ht 72.0 in | Wt 184.2 lb

## 2022-05-08 DIAGNOSIS — Z01818 Encounter for other preprocedural examination: Secondary | ICD-10-CM | POA: Insufficient documentation

## 2022-05-08 DIAGNOSIS — Z9889 Other specified postprocedural states: Secondary | ICD-10-CM | POA: Diagnosis not present

## 2022-05-08 LAB — PROTIME-INR
INR: 1 (ref 0.8–1.2)
Prothrombin Time: 13.5 seconds (ref 11.4–15.2)

## 2022-05-08 LAB — GLUCOSE, CAPILLARY: Glucose-Capillary: 185 mg/dL — ABNORMAL HIGH (ref 70–99)

## 2022-05-08 LAB — BASIC METABOLIC PANEL
Anion gap: 13 (ref 5–15)
BUN: 19 mg/dL (ref 8–23)
CO2: 27 mmol/L (ref 22–32)
Calcium: 9.7 mg/dL (ref 8.9–10.3)
Chloride: 101 mmol/L (ref 98–111)
Creatinine, Ser: 1.28 mg/dL — ABNORMAL HIGH (ref 0.61–1.24)
GFR, Estimated: 57 mL/min — ABNORMAL LOW (ref >60)
Glucose, Bld: 191 mg/dL — ABNORMAL HIGH (ref 70–99)
Potassium: 3.8 mmol/L (ref 3.5–5.1)
Sodium: 141 mmol/L (ref 135–145)

## 2022-05-08 LAB — SURGICAL PCR SCREEN
MRSA, PCR: NEGATIVE
Staphylococcus aureus: NEGATIVE

## 2022-05-08 LAB — CBC
HCT: 44.6 % (ref 39.0–52.0)
Hemoglobin: 16 g/dL (ref 13.0–17.0)
MCH: 31.3 pg (ref 26.0–34.0)
MCHC: 35.9 g/dL (ref 30.0–36.0)
MCV: 87.3 fL (ref 80.0–100.0)
Platelets: 209 10*3/uL (ref 150–400)
RBC: 5.11 MIL/uL (ref 4.22–5.81)
RDW: 12.7 % (ref 11.5–15.5)
WBC: 10.3 10*3/uL (ref 4.0–10.5)
nRBC: 0 % (ref 0.0–0.2)

## 2022-05-08 LAB — TYPE AND SCREEN
ABO/RH(D): A POS
Antibody Screen: NEGATIVE

## 2022-05-08 LAB — HEMOGLOBIN A1C
Hgb A1c MFr Bld: 6.9 % — ABNORMAL HIGH (ref 4.8–5.6)
Mean Plasma Glucose: 151.33 mg/dL

## 2022-05-08 NOTE — Progress Notes (Signed)
   05/08/22 0916  OBSTRUCTIVE SLEEP APNEA  Have you ever been diagnosed with sleep apnea through a sleep study? No  Do you snore loudly (loud enough to be heard through closed doors)?  1  Do you often feel tired, fatigued, or sleepy during the daytime (such as falling asleep during driving or talking to someone)? 0  Has anyone observed you stop breathing during your sleep? 1  Do you have, or are you being treated for high blood pressure? 1  BMI more than 35 kg/m2? 0  Age > 50 (1-yes) 1  Neck circumference greater than:Male 16 inches or larger, Male 17inches or larger? 0  Male Gender (Yes=1) 1  Obstructive Sleep Apnea Score 5  Score 5 or greater  Results sent to PCP

## 2022-05-08 NOTE — Progress Notes (Addendum)
PCP - Oneida Arenas, MD Cardiologist - Denies  PPM/ICD - Denies Device Orders  Rep Notified -   Chest x-ray - 09/04/20 EKG - 05/08/22 Stress Test - Denies ECHO - Denies Cardiac Cath - Denies  Sleep Study - Denies  DM type II Fasting Blood Sugar - Doesn't check fasting Checks Blood Sugar _daily CBG at PAT appt was 185 had OJ 0700  Blood Thinner Instructions:Denies Aspirin Instructions:81mg   Requested patient to call surgeon for instruction on when to stop.   COVID TEST- NI   Anesthesia review: Yes abnormal EKG  Patient denies shortness of breath, fever, cough and chest pain at PAT appointment   All instructions explained to the patient, with a verbal understanding of the material. Patient agrees to go over the instructions while at home for a better understanding.  The opportunity to ask questions was provided.

## 2022-05-09 NOTE — Anesthesia Preprocedure Evaluation (Signed)
Anesthesia Evaluation  Patient identified by MRN, date of birth, ID band Patient awake    Reviewed: Allergy & Precautions, NPO status , Patient's Chart, lab work & pertinent test results  Airway Mallampati: II  TM Distance: >3 FB Neck ROM: Full    Dental  (+) Teeth Intact, Dental Advisory Given   Pulmonary neg pulmonary ROS,    breath sounds clear to auscultation       Cardiovascular hypertension, Pt. on medications  Rhythm:Regular Rate:Normal     Neuro/Psych negative neurological ROS  negative psych ROS   GI/Hepatic negative GI ROS, Neg liver ROS,   Endo/Other  diabetes, Type 2, Oral Hypoglycemic AgentsHypothyroidism   Renal/GU Renal InsufficiencyRenal disease     Musculoskeletal negative musculoskeletal ROS (+)   Abdominal Normal abdominal exam  (+)   Peds  Hematology negative hematology ROS (+)   Anesthesia Other Findings   Reproductive/Obstetrics                           Anesthesia Physical Anesthesia Plan  ASA: 3  Anesthesia Plan: General   Post-op Pain Management:    Induction: Intravenous  PONV Risk Score and Plan: 3 and Ondansetron and Treatment may vary due to age or medical condition  Airway Management Planned: Oral ETT  Additional Equipment: None  Intra-op Plan:   Post-operative Plan: Extubation in OR  Informed Consent: I have reviewed the patients History and Physical, chart, labs and discussed the procedure including the risks, benefits and alternatives for the proposed anesthesia with the patient or authorized representative who has indicated his/her understanding and acceptance.     Dental advisory given  Plan Discussed with: CRNA  Anesthesia Plan Comments: (PAT note written 05/09/2022 by Myra Gianotti, PA-C. )      Anesthesia Quick Evaluation

## 2022-05-09 NOTE — Progress Notes (Signed)
Anesthesia Chart Review:  Case: [id number] Date/Time: 05/14/22 1200   Procedure: PLIF - L4-L5 - L5-S1 (Back)   Anesthesia type: General   Pre-op diagnosis: HNP   Location: [facility] OR ROOM 18 / [facility]   Surgeons: Eustace Moore, MD       DISCUSSION: Patient is an 81 year old male scheduled for the above procedure. He underwent decompressive laminectomies L3-S1 and L5-S1 microdiscectomy in 08/2020. He did well initially but by 03/2022 began having worsening LLE pain with N/T. Following MRI and 04/15/22 visit, the above surgery was recommended.    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  OSA screening score was 5.   EKG stable when compared to preoperative EKG from 2022. He denied chest pain and SOB. A1c 6.9%.   Anesthesia team to evaluate on the day of surgery.  VS: BP 124/66   Pulse 73   Temp (!) 36.4 C (Oral)   Resp 18   Ht 6' (1.829 m)   Wt 83.6 kg   SpO2 99%   BMI 24.98 kg/m   PROVIDERS: Wenda Low, MD is PCP  Festus Aloe, MD is urologist    LABS: Labs reviewed: Acceptable for surgery. (all labs ordered are listed, but only abnormal results are displayed)  Labs Reviewed  GLUCOSE, CAPILLARY - Abnormal; Notable for the following components:      Result Value   Glucose-Capillary 185 (*)    All other components within normal limits  HEMOGLOBIN A1C - Abnormal; Notable for the following components:   Hgb A1c MFr Bld 6.9 (*)    All other components within normal limits  BASIC METABOLIC PANEL - Abnormal; Notable for the following components:   Glucose, Bld 191 (*)    Creatinine, Ser 1.28  (*)    GFR, Estimated 57 (*)    All other components within normal limits  SURGICAL PCR SCREEN  PROTIME-INR  CBC  TYPE AND SCREEN     IMAGES: MRI L-spine 04/02/22 ([facility]/PACS): IMPRESSION: 1. Compared with the most recent prior study from 11/08/2020, no obvious interval postsurgical changes are identified. 2. The L3-4 level is stable with residual lateral recess and foraminal narrowing bilaterally. 3. A large right lateral recess disc extrusion at L4-5 is similar in overall size and mass effect, although the nonenhancing central disc fragment appears slightly smaller (versus increasing peripheral disc enhancement). 4. A large left lateral recess disc extrusion at L5-S1 has slightly decreased in size and mass effect with persistent left S1 nerve root encroachment. Chronic foraminal narrowing at this level appears unchanged. 5. Decreased edema and enhancing granulation tissue at the operative levels.   EKG: 05/08/22: Normal sinus rhythm Possible Inferior infarct , age undetermined Abnormal ECG When compared with ECG of no signficant change Confirmed by Dorris Carnes [phone]) on 05/08/2022 9:38:04 PM - Non-specific T wave abnormality also present on 09/04/20 tracing.   CV: N/A  Past Medical History:  Diagnosis Date   BPH (benign prostatic hyperplasia)    Diabetes mellitus without complication ([place])    Gilbert syndrome 1970's   History of kidney stones    Hypercholesteremia    Hypertension    Hypothyroidism     Past Surgical History:  Procedure Laterality Date   ANTERIOR CERVICAL DECOMP/DISCECTOMY FUSION  2007  BACK SURGERY  04/2019   CHOLECYSTECTOMY     EYE SURGERY Bilateral 10/2021   LITHOTRIPSY     x2   LUMBAR LAMINECTOMY/DECOMPRESSION MICRODISCECTOMY Left 09/10/2020   Procedure: Laminectomy and Foraminotomy - left - Lumbar three-Lumbar four - Lumbar four-Lumbar five - Lumbar five-Sacral one;  Surgeon: Eustace Moore, MD;  Location: [place];  Service:  Neurosurgery;  Laterality: Left;   PARATHYROIDECTOMY      MEDICATIONS:  amLODipine (NORVASC) 5 MG tablet   aspirin EC 81 MG tablet   atorvastatin (LIPITOR) 20 MG tablet   citalopram (CELEXA) 20 MG tablet   Coenzyme Q10 (COQ-10) 100 MG CAPS   glimepiride (AMARYL) 2 MG tablet   HYDROcodone-acetaminophen (NORCO) 7.5-325 MG tablet   ibuprofen (ADVIL) 200 MG tablet   JANUVIA 50 MG tablet   levothyroxine (SYNTHROID) 100 MCG tablet   lisinopril-hydrochlorothiazide (ZESTORETIC) 20-12.5 MG tablet   Omega-3 Fatty Acids (FISH OIL PO)   No current facility-administered medications for this encounter.  Advised to follow surgeon recommendations regarding perioperative ASA.   Myra Gianotti, PA-C Surgical Short Stay/Anesthesiology [facility] Phone [phone] [facility] Phone [phone] 05/09/2022 1:14 PM

## 2022-05-12 DIAGNOSIS — M5417 Radiculopathy, lumbosacral region: Secondary | ICD-10-CM | POA: Diagnosis not present

## 2022-05-14 ENCOUNTER — Ambulatory Visit (HOSPITAL_COMMUNITY): Payer: Medicare HMO | Admitting: Vascular Surgery

## 2022-05-14 ENCOUNTER — Encounter (HOSPITAL_COMMUNITY)
Admission: RE | Disposition: A | Discharge status: Home / Self Care | Payer: Self-pay | Source: Home / Self Care | Attending: Neurological Surgery

## 2022-05-14 ENCOUNTER — Ambulatory Visit (HOSPITAL_BASED_OUTPATIENT_CLINIC_OR_DEPARTMENT_OTHER): Payer: Medicare HMO | Admitting: Anesthesiology

## 2022-05-14 ENCOUNTER — Ambulatory Visit (HOSPITAL_COMMUNITY): Payer: Medicare HMO

## 2022-05-14 ENCOUNTER — Other Ambulatory Visit: Payer: Self-pay

## 2022-05-14 ENCOUNTER — Observation Stay (HOSPITAL_COMMUNITY)
Admission: RE | Admit: 2022-05-14 | Discharge: 2022-05-15 | Disposition: A | Discharge status: Home / Self Care | Payer: Medicare HMO | Attending: Neurological Surgery | Admitting: Neurological Surgery

## 2022-05-14 ENCOUNTER — Encounter (HOSPITAL_COMMUNITY): Payer: Self-pay | Admitting: Neurological Surgery

## 2022-05-14 DIAGNOSIS — Z79899 Other long term (current) drug therapy: Secondary | ICD-10-CM | POA: Insufficient documentation

## 2022-05-14 DIAGNOSIS — I10 Essential (primary) hypertension: Secondary | ICD-10-CM | POA: Insufficient documentation

## 2022-05-14 DIAGNOSIS — M5127 Other intervertebral disc displacement, lumbosacral region: Secondary | ICD-10-CM

## 2022-05-14 DIAGNOSIS — F17220 Nicotine dependence, chewing tobacco, uncomplicated: Secondary | ICD-10-CM | POA: Diagnosis not present

## 2022-05-14 DIAGNOSIS — E039 Hypothyroidism, unspecified: Secondary | ICD-10-CM | POA: Insufficient documentation

## 2022-05-14 DIAGNOSIS — E119 Type 2 diabetes mellitus without complications: Secondary | ICD-10-CM | POA: Diagnosis not present

## 2022-05-14 DIAGNOSIS — R69 Illness, unspecified: Secondary | ICD-10-CM | POA: Diagnosis not present

## 2022-05-14 DIAGNOSIS — Z7984 Long term (current) use of oral hypoglycemic drugs: Secondary | ICD-10-CM

## 2022-05-14 DIAGNOSIS — F17290 Nicotine dependence, other tobacco product, uncomplicated: Secondary | ICD-10-CM

## 2022-05-14 DIAGNOSIS — Z981 Arthrodesis status: Secondary | ICD-10-CM

## 2022-05-14 DIAGNOSIS — Z7982 Long term (current) use of aspirin: Secondary | ICD-10-CM | POA: Diagnosis not present

## 2022-05-14 DIAGNOSIS — M4326 Fusion of spine, lumbar region: Secondary | ICD-10-CM | POA: Diagnosis not present

## 2022-05-14 DIAGNOSIS — M5126 Other intervertebral disc displacement, lumbar region: Secondary | ICD-10-CM | POA: Diagnosis not present

## 2022-05-14 DIAGNOSIS — M5417 Radiculopathy, lumbosacral region: Secondary | ICD-10-CM | POA: Diagnosis not present

## 2022-05-14 DIAGNOSIS — Z9889 Other specified postprocedural states: Secondary | ICD-10-CM

## 2022-05-14 LAB — GLUCOSE, CAPILLARY
Glucose-Capillary: 168 mg/dL — ABNORMAL HIGH (ref 70–99)
Glucose-Capillary: 237 mg/dL — ABNORMAL HIGH (ref 70–99)
Glucose-Capillary: 280 mg/dL — ABNORMAL HIGH (ref 70–99)

## 2022-05-14 LAB — ABO/RH: ABO/RH(D): A POS

## 2022-05-14 SURGERY — POSTERIOR LUMBAR FUSION 2 LEVEL
Anesthesia: General | Site: Back

## 2022-05-14 MED ORDER — ORAL CARE MOUTH RINSE: 15.0000 mL | Freq: Once | OROMUCOSAL | Status: AC

## 2022-05-14 MED ORDER — ROCURONIUM BROMIDE 10 MG/ML (PF) SYRINGE
PREFILLED_SYRINGE | INTRAVENOUS | Status: AC
  Filled 2022-05-14: qty 10

## 2022-05-14 MED ORDER — ASPIRIN 81 MG PO TBEC
81.0000 mg | DELAYED_RELEASE_TABLET | Freq: Every day | ORAL | Status: DC
  Administered 2022-05-15: 81 mg via ORAL
  Filled 2022-05-14: qty 1

## 2022-05-14 MED ORDER — BUPIVACAINE HCL (PF) 0.25 % IJ SOLN
INTRAMUSCULAR | Status: AC
  Filled 2022-05-14: qty 30

## 2022-05-14 MED ORDER — ACETAMINOPHEN 325 MG PO TABS: 325.0000 mg | ORAL_TABLET | Freq: Once | ORAL | Status: DC | PRN

## 2022-05-14 MED ORDER — ALBUMIN HUMAN 5 % IV SOLN: INTRAVENOUS | Status: DC | PRN

## 2022-05-14 MED ORDER — CHLORHEXIDINE GLUCONATE 0.12 % MT SOLN
15.0000 mL | Freq: Once | OROMUCOSAL | Status: AC
  Administered 2022-05-14: 15 mL via OROMUCOSAL
  Filled 2022-05-14: qty 15

## 2022-05-14 MED ORDER — HYDROCHLOROTHIAZIDE 12.5 MG PO TABS
12.5000 mg | ORAL_TABLET | Freq: Every day | ORAL | Status: DC
  Administered 2022-05-14 – 2022-05-15 (×2): 12.5 mg via ORAL
  Filled 2022-05-14 (×2): qty 1

## 2022-05-14 MED ORDER — FENTANYL CITRATE (PF) 250 MCG/5ML IJ SOLN
INTRAMUSCULAR | Status: DC | PRN
  Administered 2022-05-14 (×3): 50 ug via INTRAVENOUS

## 2022-05-14 MED ORDER — CHLORHEXIDINE GLUCONATE CLOTH 2 % EX PADS: 6.0000 | MEDICATED_PAD | Freq: Once | CUTANEOUS | Status: DC

## 2022-05-14 MED ORDER — PHENOL 1.4 % MT LIQD: 1.0000 | OROMUCOSAL | Status: DC | PRN

## 2022-05-14 MED ORDER — INSULIN ASPART 100 UNIT/ML IJ SOLN
0.0000 [IU] | Freq: Three times a day (TID) | INTRAMUSCULAR | Status: DC
  Administered 2022-05-15: 3 [IU] via SUBCUTANEOUS

## 2022-05-14 MED ORDER — FENTANYL CITRATE (PF) 250 MCG/5ML IJ SOLN
INTRAMUSCULAR | Status: AC
  Filled 2022-05-14: qty 5

## 2022-05-14 MED ORDER — SODIUM CHLORIDE 0.9 % IV SOLN: 250.0000 mL | INTRAVENOUS | Status: DC

## 2022-05-14 MED ORDER — SENNA 8.6 MG PO TABS
1.0000 | ORAL_TABLET | Freq: Two times a day (BID) | ORAL | Status: DC
  Administered 2022-05-14 – 2022-05-15 (×2): 8.6 mg via ORAL
  Filled 2022-05-14 (×2): qty 1

## 2022-05-14 MED ORDER — ACETAMINOPHEN 500 MG PO TABS
1000.0000 mg | ORAL_TABLET | ORAL | Status: AC
  Administered 2022-05-14: 1000 mg via ORAL
  Filled 2022-05-14: qty 2

## 2022-05-14 MED ORDER — METHOCARBAMOL 500 MG PO TABS
500.0000 mg | ORAL_TABLET | Freq: Four times a day (QID) | ORAL | Status: DC | PRN
  Administered 2022-05-14 – 2022-05-15 (×2): 500 mg via ORAL
  Filled 2022-05-14 (×2): qty 1

## 2022-05-14 MED ORDER — CELECOXIB 200 MG PO CAPS
200.0000 mg | ORAL_CAPSULE | Freq: Two times a day (BID) | ORAL | Status: DC
  Administered 2022-05-14 – 2022-05-15 (×2): 200 mg via ORAL
  Filled 2022-05-14 (×2): qty 1

## 2022-05-14 MED ORDER — PROMETHAZINE HCL 25 MG/ML IJ SOLN: 6.2500 mg | INTRAMUSCULAR | Status: DC | PRN

## 2022-05-14 MED ORDER — CEFAZOLIN SODIUM-DEXTROSE 2-4 GM/100ML-% IV SOLN
2.0000 g | INTRAVENOUS | Status: AC
  Administered 2022-05-14: 2 g via INTRAVENOUS
  Filled 2022-05-14: qty 100

## 2022-05-14 MED ORDER — ROCURONIUM BROMIDE 10 MG/ML (PF) SYRINGE
PREFILLED_SYRINGE | INTRAVENOUS | Status: DC | PRN
  Administered 2022-05-14 (×2): 20 mg via INTRAVENOUS
  Administered 2022-05-14: 10 mg via INTRAVENOUS
  Administered 2022-05-14: 60 mg via INTRAVENOUS

## 2022-05-14 MED ORDER — LIDOCAINE 2% (20 MG/ML) 5 ML SYRINGE
INTRAMUSCULAR | Status: AC
  Filled 2022-05-14: qty 5

## 2022-05-14 MED ORDER — INSULIN ASPART 100 UNIT/ML IJ SOLN
0.0000 [IU] | Freq: Every day | INTRAMUSCULAR | Status: DC
  Administered 2022-05-14: 3 [IU] via SUBCUTANEOUS

## 2022-05-14 MED ORDER — THROMBIN 5000 UNITS EX SOLR
OROMUCOSAL | Status: DC | PRN
  Administered 2022-05-14: 5 mL via TOPICAL

## 2022-05-14 MED ORDER — THROMBIN 20000 UNITS EX SOLR
CUTANEOUS | Status: AC
  Filled 2022-05-14: qty 20000

## 2022-05-14 MED ORDER — PHENYLEPHRINE 80 MCG/ML (10ML) SYRINGE FOR IV PUSH (FOR BLOOD PRESSURE SUPPORT)
PREFILLED_SYRINGE | INTRAVENOUS | Status: AC
  Filled 2022-05-14: qty 10

## 2022-05-14 MED ORDER — METHOCARBAMOL 1000 MG/10ML IJ SOLN: 500.0000 mg | Freq: Four times a day (QID) | INTRAVENOUS | Status: DC | PRN

## 2022-05-14 MED ORDER — SUGAMMADEX SODIUM 200 MG/2ML IV SOLN
INTRAVENOUS | Status: DC | PRN
  Administered 2022-05-14: 200 mg via INTRAVENOUS

## 2022-05-14 MED ORDER — EPHEDRINE 5 MG/ML INJ
INTRAVENOUS | Status: AC
  Filled 2022-05-14: qty 5

## 2022-05-14 MED ORDER — ONDANSETRON HCL 4 MG/2ML IJ SOLN
INTRAMUSCULAR | Status: DC | PRN
  Administered 2022-05-14: 4 mg via INTRAVENOUS

## 2022-05-14 MED ORDER — SODIUM CHLORIDE 0.9% FLUSH
3.0000 mL | Freq: Two times a day (BID) | INTRAVENOUS | Status: DC
  Administered 2022-05-14: 3 mL via INTRAVENOUS

## 2022-05-14 MED ORDER — DEXAMETHASONE SODIUM PHOSPHATE 10 MG/ML IJ SOLN
INTRAMUSCULAR | Status: DC | PRN
  Administered 2022-05-14: 10 mg via INTRAVENOUS

## 2022-05-14 MED ORDER — LISINOPRIL-HYDROCHLOROTHIAZIDE 20-12.5 MG PO TABS: 1.0000 | ORAL_TABLET | Freq: Every day | ORAL | Status: DC

## 2022-05-14 MED ORDER — LEVOTHYROXINE SODIUM 100 MCG PO TABS
100.0000 ug | ORAL_TABLET | Freq: Every day | ORAL | Status: DC
  Administered 2022-05-15: 100 ug via ORAL
  Filled 2022-05-14: qty 1

## 2022-05-14 MED ORDER — CITALOPRAM HYDROBROMIDE 20 MG PO TABS: 20.0000 mg | ORAL_TABLET | Freq: Every evening | ORAL | Status: DC

## 2022-05-14 MED ORDER — AMISULPRIDE (ANTIEMETIC) 5 MG/2ML IV SOLN: 10.0000 mg | Freq: Once | INTRAVENOUS | Status: DC | PRN

## 2022-05-14 MED ORDER — SODIUM CHLORIDE 0.9% FLUSH: 3.0000 mL | INTRAVENOUS | Status: DC | PRN

## 2022-05-14 MED ORDER — AMLODIPINE BESYLATE 5 MG PO TABS
5.0000 mg | ORAL_TABLET | Freq: Every day | ORAL | Status: DC
  Administered 2022-05-15: 5 mg via ORAL
  Filled 2022-05-14: qty 1

## 2022-05-14 MED ORDER — ONDANSETRON HCL 4 MG/2ML IJ SOLN
INTRAMUSCULAR | Status: AC
  Filled 2022-05-14: qty 2

## 2022-05-14 MED ORDER — THROMBIN 5000 UNITS EX SOLR
CUTANEOUS | Status: AC
  Filled 2022-05-14: qty 5000

## 2022-05-14 MED ORDER — SODIUM CHLORIDE 0.9 % IV SOLN: INTRAVENOUS | Status: DC | PRN

## 2022-05-14 MED ORDER — ONDANSETRON HCL 4 MG/2ML IJ SOLN: 4.0000 mg | Freq: Four times a day (QID) | INTRAMUSCULAR | Status: DC | PRN

## 2022-05-14 MED ORDER — LIDOCAINE 2% (20 MG/ML) 5 ML SYRINGE
INTRAMUSCULAR | Status: DC | PRN
  Administered 2022-05-14: 80 mg via INTRAVENOUS

## 2022-05-14 MED ORDER — GABAPENTIN 300 MG PO CAPS
300.0000 mg | ORAL_CAPSULE | ORAL | Status: AC
  Administered 2022-05-14: 300 mg via ORAL
  Filled 2022-05-14: qty 1

## 2022-05-14 MED ORDER — ONDANSETRON HCL 4 MG PO TABS: 4.0000 mg | ORAL_TABLET | Freq: Four times a day (QID) | ORAL | Status: DC | PRN

## 2022-05-14 MED ORDER — ACETAMINOPHEN 160 MG/5ML PO SOLN: 325.0000 mg | Freq: Once | ORAL | Status: DC | PRN

## 2022-05-14 MED ORDER — LINAGLIPTIN 5 MG PO TABS
5.0000 mg | ORAL_TABLET | Freq: Every day | ORAL | Status: DC
  Administered 2022-05-15: 5 mg via ORAL
  Filled 2022-05-14: qty 1

## 2022-05-14 MED ORDER — 0.9 % SODIUM CHLORIDE (POUR BTL) OPTIME
TOPICAL | Status: DC | PRN
  Administered 2022-05-14: 1000 mL

## 2022-05-14 MED ORDER — INSULIN ASPART 100 UNIT/ML IJ SOLN
0.0000 [IU] | Freq: Three times a day (TID) | INTRAMUSCULAR | Status: DC
  Administered 2022-05-14: 5 [IU] via SUBCUTANEOUS

## 2022-05-14 MED ORDER — BUPIVACAINE HCL (PF) 0.25 % IJ SOLN
INTRAMUSCULAR | Status: DC | PRN
  Administered 2022-05-14: 6 mL

## 2022-05-14 MED ORDER — PHENYLEPHRINE 80 MCG/ML (10ML) SYRINGE FOR IV PUSH (FOR BLOOD PRESSURE SUPPORT)
PREFILLED_SYRINGE | INTRAVENOUS | Status: DC | PRN
  Administered 2022-05-14: 80 ug via INTRAVENOUS
  Administered 2022-05-14: 160 ug via INTRAVENOUS

## 2022-05-14 MED ORDER — PHENYLEPHRINE HCL-NACL 20-0.9 MG/250ML-% IV SOLN
INTRAVENOUS | Status: DC | PRN
  Administered 2022-05-14: 30 ug/min via INTRAVENOUS

## 2022-05-14 MED ORDER — DEXAMETHASONE SODIUM PHOSPHATE 10 MG/ML IJ SOLN
INTRAMUSCULAR | Status: AC
  Filled 2022-05-14: qty 1

## 2022-05-14 MED ORDER — HYDROCODONE-ACETAMINOPHEN 7.5-325 MG PO TABS
1.0000 | ORAL_TABLET | ORAL | Status: DC | PRN
  Administered 2022-05-14 – 2022-05-15 (×3): 1 via ORAL
  Filled 2022-05-14 (×3): qty 1

## 2022-05-14 MED ORDER — POTASSIUM CHLORIDE IN NACL 20-0.9 MEQ/L-% IV SOLN
INTRAVENOUS | Status: DC
  Filled 2022-05-14: qty 1000

## 2022-05-14 MED ORDER — LISINOPRIL 20 MG PO TABS
20.0000 mg | ORAL_TABLET | Freq: Every day | ORAL | Status: DC
  Administered 2022-05-14 – 2022-05-15 (×2): 20 mg via ORAL
  Filled 2022-05-14 (×2): qty 1

## 2022-05-14 MED ORDER — TAMSULOSIN HCL 0.4 MG PO CAPS
0.4000 mg | ORAL_CAPSULE | Freq: Every day | ORAL | Status: DC
  Administered 2022-05-14 – 2022-05-15 (×2): 0.4 mg via ORAL
  Filled 2022-05-14 (×2): qty 1

## 2022-05-14 MED ORDER — LACTATED RINGERS IV SOLN: INTRAVENOUS | Status: DC

## 2022-05-14 MED ORDER — PROPOFOL 10 MG/ML IV BOLUS
INTRAVENOUS | Status: DC | PRN
  Administered 2022-05-14: 130 mg via INTRAVENOUS

## 2022-05-14 MED ORDER — HYDROMORPHONE HCL 1 MG/ML IJ SOLN: 0.5000 mg | INTRAMUSCULAR | Status: DC | PRN

## 2022-05-14 MED ORDER — ACETAMINOPHEN 10 MG/ML IV SOLN: 1000.0000 mg | Freq: Once | INTRAVENOUS | Status: DC | PRN

## 2022-05-14 MED ORDER — MENTHOL 3 MG MT LOZG: 1.0000 | LOZENGE | OROMUCOSAL | Status: DC | PRN

## 2022-05-14 MED ORDER — CEFAZOLIN SODIUM-DEXTROSE 2-4 GM/100ML-% IV SOLN
2.0000 g | Freq: Three times a day (TID) | INTRAVENOUS | Status: AC
  Administered 2022-05-14 – 2022-05-15 (×2): 2 g via INTRAVENOUS
  Filled 2022-05-14 (×2): qty 100

## 2022-05-14 MED ORDER — THROMBIN 20000 UNITS EX SOLR
CUTANEOUS | Status: DC | PRN
  Administered 2022-05-14: 20 mL via TOPICAL

## 2022-05-14 MED ORDER — HYDROMORPHONE HCL 1 MG/ML IJ SOLN: 0.2500 mg | INTRAMUSCULAR | Status: DC | PRN

## 2022-05-14 MED ORDER — PROPOFOL 10 MG/ML IV BOLUS
INTRAVENOUS | Status: AC
  Filled 2022-05-14: qty 20

## 2022-05-14 MED ORDER — GLIMEPIRIDE 2 MG PO TABS
2.0000 mg | ORAL_TABLET | Freq: Every day | ORAL | Status: DC
  Administered 2022-05-14: 2 mg via ORAL
  Filled 2022-05-14: qty 1

## 2022-05-14 SURGICAL SUPPLY — 69 items
ADH SKN CLS APL DERMABOND .7 (GAUZE/BANDAGES/DRESSINGS) ×1
APL SKNCLS STERI-STRIP NONHPOA (GAUZE/BANDAGES/DRESSINGS) ×1
BAG COUNTER SPONGE SURGICOUNT (BAG) ×2 IMPLANT
BAG SPNG CNTER NS LX DISP (BAG) ×2
BASKET BONE COLLECTION (BASKET) ×2 IMPLANT
BENZOIN TINCTURE PRP APPL 2/3 (GAUZE/BANDAGES/DRESSINGS) ×2 IMPLANT
BLADE BONE MILL MEDIUM (MISCELLANEOUS) ×2 IMPLANT
BLADE CLIPPER SURG (BLADE) IMPLANT
BONE CANC CHIPS 20CC PCAN1/4 (Bone Implant) ×1 IMPLANT
BUR CARBIDE MATCH 3.0 (BURR) ×2 IMPLANT
CANISTER SUCT 3000ML PPV (MISCELLANEOUS) ×2 IMPLANT
CHIPS CANC BONE 20CC PCAN1/4 (Bone Implant) ×1 IMPLANT
CNTNR URN SCR LID CUP LEK RST (MISCELLANEOUS) ×2 IMPLANT
CONT SPEC 4OZ STRL OR WHT (MISCELLANEOUS) ×1
COVER BACK TABLE 60X90IN (DRAPES) ×2 IMPLANT
DERMABOND ADVANCED .7 DNX12 (GAUZE/BANDAGES/DRESSINGS) ×2 IMPLANT
DRAPE C-ARM 42X72 X-RAY (DRAPES) ×4 IMPLANT
DRAPE C-ARMOR (DRAPES) ×2 IMPLANT
DRAPE LAPAROTOMY 100X72X124 (DRAPES) ×2 IMPLANT
DRAPE SURG 17X23 STRL (DRAPES) ×2 IMPLANT
DRSG OPSITE POSTOP 4X6 (GAUZE/BANDAGES/DRESSINGS) IMPLANT
DURAPREP 26ML APPLICATOR (WOUND CARE) ×2 IMPLANT
ELECT REM PT RETURN 9FT ADLT (ELECTROSURGICAL) ×1
ELECTRODE REM PT RTRN 9FT ADLT (ELECTROSURGICAL) ×2 IMPLANT
EVACUATOR 1/8 PVC DRAIN (DRAIN) ×2 IMPLANT
GAUZE 4X4 16PLY ~~LOC~~+RFID DBL (SPONGE) IMPLANT
GLOVE BIO SURGEON STRL SZ7 (GLOVE) IMPLANT
GLOVE BIO SURGEON STRL SZ8 (GLOVE) ×4 IMPLANT
GLOVE BIOGEL PI IND STRL 7.0 (GLOVE) IMPLANT
GOWN STRL REUS W/ TWL LRG LVL3 (GOWN DISPOSABLE) IMPLANT
GOWN STRL REUS W/ TWL XL LVL3 (GOWN DISPOSABLE) ×4 IMPLANT
GOWN STRL REUS W/TWL 2XL LVL3 (GOWN DISPOSABLE) IMPLANT
GOWN STRL REUS W/TWL LRG LVL3 (GOWN DISPOSABLE) ×1
GOWN STRL REUS W/TWL XL LVL3 (GOWN DISPOSABLE) ×3
GRAFT BNE CANC CHIPS 1-8 20CC (Bone Implant) IMPLANT
GRAFT BONE PROTEIOS XL 10CC (Orthopedic Implant) IMPLANT
HEMOSTAT POWDER KIT SURGIFOAM (HEMOSTASIS) ×2 IMPLANT
KIT BASIN OR (CUSTOM PROCEDURE TRAY) ×2 IMPLANT
KIT GRAFTMAG DEL NEURO DISP (NEUROSURGERY SUPPLIES) IMPLANT
KIT TURNOVER KIT B (KITS) ×2 IMPLANT
MATRIX STRIP NEOCORE 12C (Putty) IMPLANT
MILL BONE PREP (MISCELLANEOUS) ×2 IMPLANT
NDL HYPO 25X1 1.5 SAFETY (NEEDLE) ×2 IMPLANT
NEEDLE HYPO 25X1 1.5 SAFETY (NEEDLE) ×1 IMPLANT
NS IRRIG 1000ML POUR BTL (IV SOLUTION) ×2 IMPLANT
PACK LAMINECTOMY NEURO (CUSTOM PROCEDURE TRAY) ×2 IMPLANT
PAD ARMBOARD 7.5X6 YLW CONV (MISCELLANEOUS) ×6 IMPLANT
ROD LORD LIPPED TI 5.5X60 (Rod) IMPLANT
ROD LORD LIPPED TI 5.5X65 (Rod) IMPLANT
SCREW CANC SHANK MOD 6.5X35 (Screw) IMPLANT
SCREW CORT SHANK MOD 6.5X40 (Screw) IMPLANT
SCREW KODIAK 6.5X40 (Screw) IMPLANT
SCREW POLYAXIAL TULIP (Screw) IMPLANT
SET SCREW (Screw) ×6 IMPLANT
SET SCREW SPNE (Screw) IMPLANT
SPACER PS POROUS 8X9X25 10D (Spacer) IMPLANT
SPONGE SURGIFOAM ABS GEL 100 (HEMOSTASIS) ×2 IMPLANT
SPONGE T-LAP 4X18 ~~LOC~~+RFID (SPONGE) IMPLANT
STRIP CLOSURE SKIN 1/2X4 (GAUZE/BANDAGES/DRESSINGS) ×4 IMPLANT
STRIP MATRIX NEOCORE 12CC (Putty) ×1 IMPLANT
SUT VIC AB 0 CT1 18XCR BRD8 (SUTURE) ×2 IMPLANT
SUT VIC AB 0 CT1 8-18 (SUTURE) ×1
SUT VIC AB 2-0 CP2 18 (SUTURE) ×2 IMPLANT
SUT VIC AB 3-0 SH 8-18 (SUTURE) ×4 IMPLANT
SYR CONTROL 10ML LL (SYRINGE) ×2 IMPLANT
TOWEL GREEN STERILE (TOWEL DISPOSABLE) ×2 IMPLANT
TOWEL GREEN STERILE FF (TOWEL DISPOSABLE) ×2 IMPLANT
TRAY FOLEY MTR SLVR 16FR STAT (SET/KITS/TRAYS/PACK) ×2 IMPLANT
WATER STERILE IRR 1000ML POUR (IV SOLUTION) ×2 IMPLANT

## 2022-05-14 NOTE — H&P (Signed)
Subjective: Patient is a 81 y.o. male admitted for recurrent HNP. Onset of symptoms was several months ago, rapidly worsening since that time.  The pain is rated intense, and is located at the across the lower back and radiates to LLE. The pain is described as aching and occurs all day. The symptoms have been progressive. Symptoms are exacerbated by exercise, standing, and walking for more than a few minutes. MRI or CT showed large HNP l4-5, 3rd recurrent HNP L5-S1 L   Past Medical History:  Diagnosis Date   BPH (benign prostatic hyperplasia)    Diabetes mellitus without complication ([place])    Gilbert syndrome 1970's   History of kidney stones    Hypercholesteremia    Hypertension    Hypothyroidism     Past Surgical History:  Procedure Laterality Date   ANTERIOR CERVICAL DECOMP/DISCECTOMY FUSION  2007   BACK SURGERY  04/2019   CHOLECYSTECTOMY     EYE SURGERY Bilateral 10/2021   LITHOTRIPSY     x2   LUMBAR LAMINECTOMY/DECOMPRESSION MICRODISCECTOMY Left 09/10/2020   Procedure: Laminectomy and Foraminotomy - left - Lumbar three-Lumbar four - Lumbar four-Lumbar five - Lumbar five-Sacral one;  Surgeon: Eustace Moore, MD;  Location: [place];  Service: Neurosurgery;  Laterality: Left;   PARATHYROIDECTOMY      Prior to Admission medications   Medication Sig Start Date End Date Taking? Authorizing Provider  amLODipine (NORVASC) 5 MG tablet Take 5 mg by mouth daily.   Yes [provider]  aspirin EC 81 MG tablet Take 81 mg by mouth daily. Swallow whole.   Yes [provider]  atorvastatin (LIPITOR) 20 MG tablet Take 20 mg by mouth every evening. 07/13/20  Yes [provider]  citalopram (CELEXA) 20 MG tablet Take 20 mg by mouth every evening.   Yes [provider]  Coenzyme Q10 (COQ-10) 100 MG CAPS Take 100 mg by mouth daily.   Yes [provider]  glimepiride (AMARYL) 2 MG tablet Take 2 mg by mouth daily with supper.   Yes [provider]   ibuprofen (ADVIL) 200 MG tablet Take 400 mg by mouth every 6 (six) hours as needed for moderate pain.   Yes [provider]  JANUVIA 50 MG tablet Take 50 mg by mouth daily. 07/29/20  Yes [provider]  levothyroxine (SYNTHROID) 100 MCG tablet Take 100 mcg by mouth daily. 06/15/20  Yes [provider]  lisinopril-hydrochlorothiazide (ZESTORETIC) 20-12.5 MG tablet Take 1 tablet by mouth daily. 02/08/22  Yes [provider]  HYDROcodone-acetaminophen (NORCO) 7.5-325 MG tablet Take 1 tablet by mouth every 6 (six) hours. Patient not taking: Reported on 05/05/2022 09/11/20   Eustace Moore, MD  Omega-3 Fatty Acids (FISH OIL PO) Take 1 capsule by mouth daily.    [provider]   Allergies  Allergen Reactions   Metformin And Related Other (See Comments)    Upset stomach   Tetracyclines & Related Rash and Other (See Comments)    Sun exposure caused rash    Social History   Tobacco Use   Smoking status: Never   Smokeless tobacco: Current    Types: Chew  Substance Use Topics   Alcohol use: Yes    Comment: rarely    History reviewed. No pertinent family history.   Review of Systems  Positive ROS: neg  All other systems have been reviewed and were otherwise negative with the exception of those mentioned in the HPI and as above.  Objective: Vital signs  in last 24 hours: Temp:  [97.6 F (36.4 C)] 97.6 F (36.4 C) (11/01 1027) Pulse Rate:  [66] 66 (11/01 1027) Resp:  [17] 17 (11/01 1027) BP: (138)/(58) 138/58 (11/01 1027) SpO2:  [95 %] 95 % (11/01 1027) Weight:  [83.9 kg] 83.9 kg (11/01 1027)  General Appearance: Alert, cooperative, no distress, appears stated age Head: Normocephalic, without obvious abnormality, atraumatic Eyes: PERRL, conjunctiva/corneas clear, EOM's intact    Neck: Supple, symmetrical, trachea midline Back: Symmetric, no curvature, ROM normal, no CVA tenderness Lungs:  respirations unlabored Heart: Regular rate and  rhythm Abdomen: Soft, non-tender Extremities: Extremities normal, atraumatic, no cyanosis or edema Pulses: 2+ and symmetric all extremities Skin: Skin color, texture, turgor normal, no rashes or lesions  NEUROLOGIC:   Mental status: Alert and oriented x4,  no aphasia, good attention span, fund of knowledge, and memory Motor Exam - grossly normal Sensory Exam - grossly normal Reflexes: 1= Coordination - grossly normal Gait - grossly normal Balance - grossly normal Cranial Nerves: I: smell Not tested  II: visual acuity  OS: nl    OD: nl  II: visual fields Full to confrontation  II: pupils Equal, round, reactive to light  III,VII: ptosis None  III,IV,VI: extraocular muscles  Full ROM  V: mastication Normal  V: facial light touch sensation  Normal  V,VII: corneal reflex  Present  VII: facial muscle function - upper  Normal  VII: facial muscle function - lower Normal  VIII: hearing Not tested  IX: soft palate elevation  Normal  IX,X: gag reflex Present  XI: trapezius strength  5/5  XI: sternocleidomastoid strength 5/5  XI: neck flexion strength  5/5  XII: tongue strength  Normal    Data Review Lab Results  Component Value Date   WBC 10.3 05/08/2022   HGB 16.0 05/08/2022   HCT 44.6 05/08/2022   MCV 87.3 05/08/2022   PLT 209 05/08/2022   Lab Results  Component Value Date   NA 141 05/08/2022   K 3.8 05/08/2022   CL 101 05/08/2022   CO2 27 05/08/2022   BUN 19 05/08/2022   CREATININE 1.28 (H) 05/08/2022   GLUCOSE 191 (H) 05/08/2022   Lab Results  Component Value Date   INR 1.0 05/08/2022    Assessment/Plan:  Estimated body mass index is 25.09 kg/m as calculated from the following:   Height as of this encounter: 6' (1.829 m).   Weight as of this encounter: 83.9 kg. Patient admitted for PLIF L4-5 L5-S1. Patient has failed a reasonable attempt at conservative therapy.  I explained the condition and procedure to the patient and answered any questions.  Patient  wishes to proceed with procedure as planned. Understands risks/ benefits and typical outcomes of procedure.   Eustace Moore 05/14/2022 11:01 AM

## 2022-05-14 NOTE — Op Note (Signed)
05/14/2022  2:46 PM  PATIENT:  Peter Aguilar  81 y.o. male  PRE-OPERATIVE DIAGNOSIS: Recurrent disc herniation L5-S1 on the left, recurrent disc herniation L4-5 on the right, left leg pain  POST-OPERATIVE DIAGNOSIS:  same  PROCEDURE:   1. Decompressive lumbar laminectomy, hemi facetectomy and foraminotomies L5-S1 requiring more work than would be required for a simple exposure of the disk for PLIF in order to adequately decompress the neural elements and address the spinal stenosis, with a repeat discectomy L5-S1 on the left 2. Posterior lumbar interbody fusion L5 S1 using PTI interbody cages packed with morcellized allograft and autograft  3. Posterior fixation L4-S1 inclusive using ATEC cortical pedicle screws.  4. Intertransverse arthrodesis L4-5 L5-S1 using morcellized autograft and allograft.  SURGEON:  Sherley Bounds, MD  ASSISTANTS: Glenford Peers FNP  ANESTHESIA:  General  EBL: 300 ml  Total I/O In: 1350 [I.V.:1100; IV Piggyback:250] Out: 440 [Urine:140; Blood:300]  BLOOD ADMINISTERED:none  DRAINS: none   INDICATION FOR PROCEDURE: This patient presented with severe recurrent left leg pain. Imaging revealed an old chronic recurrent disc herniation L4-5 on the right which was asymptomatic, and a large recurrent disc herniation L5-S1 on the left. The patient tried a reasonable attempt at conservative medical measures without relief. I recommended decompression and instrumented fusion to address the stenosis as well as the segmental  instability.  Patient understood the risks, benefits, and alternatives and potential outcomes and wished to proceed.  I originally recommended a posterior lumbar interbody fusion at L4-5 and L5-S1, but given his previous bilateral decompressive laminectomy at L4-5 and the fact that we felt he was asymptomatic from the level, we felt it was best to simply instrument the level and perform a posterior lateral fusion without redo decompression and  interbody fusion because of the risk of neural injury and/or CSF leak.    PROCEDURE DETAILS:  The patient was brought to the operating room. After induction of generalized endotracheal anesthesia the patient was rolled into the prone position on chest rolls and all pressure points were padded. The patient's lumbar region was cleaned and then prepped with DuraPrep and draped in the usual sterile fashion. Anesthesia was injected and then a dorsal midline incision was made and carried down to the lumbosacral fascia. The fascia was opened and the paraspinous musculature was taken down in a subperiosteal fashion to expose L4-5 and L5-S1. A self-retaining retractor was placed. Intraoperative fluoroscopy confirmed my level, and I started with placement of the L4 and L5 cortical pedicle screws. The pedicle screw entry zones were identified utilizing surface landmarks and  AP and lateral fluoroscopy. I scored the cortex with the high-speed drill and then used the hand drill to drill an upward and outward direction into the pedicle. I then tapped line to line. I then placed a 6.5 x 40 mm cortical pedicle screw into the pedicles of L4 bilaterally, 6.5 x 35 mm cortical pedicle screws at L5.    I then turned my attention to the decompression and complete lumbar laminectomies, hemi- facetectomies, and foraminotomies were performed at L5-S1.  My nurse practitioner was directly involved in the decompression and exposure of the neural elements. the patient had significant spinal stenosis and this required more work than would be required for a simple exposure of the disc for posterior lumbar interbody fusion which would only require a limited laminotomy. Much more generous decompression and generous foraminotomy was undertaken in order to adequately decompress the neural elements and address the patient's leg pain. The  yellow ligament was removed to expose the underlying dura and nerve roots, and generous foraminotomies were  performed to adequately decompress the neural elements. Both the exiting and traversing nerve roots were decompressed on both sides until a coronary dilator passed easily along the nerve roots.  At L5-S1 on the left there was a large recurrent disc herniation but there was significant epidural fibrosis we spent considerable time utilizing a nerve hook to remove multiple disc fragments from inferior to the disc space and medial to the S1 nerve root.  At No time did we see an unintended durotomy.  Once the decompression was complete, I turned my attention to the posterior lumbar interbody fusion. The epidural venous vasculature was coagulated and cut sharply. Disc space was incised and the initial discectomy was performed with pituitary rongeurs. The disc space was distracted with sequential distractors to a height of 9 mm. We then used a series of scrapers and shavers to prepare the endplates for fusion. The midline was prepared with Epstein curettes. Once the complete discectomy was finished, we packed an appropriate sized interbody cage with local autograft and morcellized allograft, gently retracted the nerve root, and tapped the cage into position at L5-S1.  The midline between the cages was packed with morselized autograft and allograft.   We then turned our attention to the placement of the lower pedicle screws. The pedicle screw entry zones were identified utilizing surface landmarks and fluoroscopy. I drilled into each pedicle utilizing the hand drill, and tapped each pedicle with the appropriate 5.5 tap. We palpated with a ball probe to assure no break in the cortex. We then placed 6.5 x 40 mm pedicle screws into the pedicles bilaterally at S1.  My nurse practitioner assisted in placement of the pedicle screws.  We then decorticated the transverse processes and laid a mixture of morcellized autograft and allograft out over these to perform intertransverse arthrodesis at L4-5 and L5-S1. We then placed  lordotic rods into the multiaxial screw heads of the pedicle screws and locked these in position with the locking caps and anti-torque device. We then checked our construct with AP and lateral fluoroscopy. Irrigated with copious amounts of bacitracin-containing saline solution. Inspected the nerve roots once again to assure adequate decompression, lined to the dura with Gelfoam,  and then we closed the muscle and the fascia with 0 Vicryl. Closed the subcutaneous tissues with 2-0 Vicryl and subcuticular tissues with 3-0 Vicryl. The skin was closed with benzoin and Steri-Strips. Dressing was then applied, the patient was awakened from general anesthesia and transported to the recovery room in stable condition. At the end of the procedure all sponge, needle and instrument counts were correct.   PLAN OF CARE: admit to inpatient  PATIENT DISPOSITION:  PACU - hemodynamically stable.   Delay start of Pharmacological VTE agent (>24hrs) due to surgical blood loss or risk of bleeding:  yes

## 2022-05-14 NOTE — Anesthesia Postprocedure Evaluation (Signed)
Anesthesia Post Note  Patient: Peter Aguilar  Procedure(s) Performed: POSTERIOR LATERAL FUSION INSTRUMENTATION - LUMBAR FOUR-LUMBAR FIVE - POSTERIOR LUMBAR INTERBODY FUSION LUMBAR FIVE-SACRAL ONE (Back)     Patient location during evaluation: PACU Anesthesia Type: General Level of consciousness: awake and alert Pain management: pain level controlled Vital Signs Assessment: post-procedure vital signs reviewed and stable Respiratory status: spontaneous breathing, nonlabored ventilation, respiratory function stable and patient connected to nasal cannula oxygen Cardiovascular status: blood pressure returned to baseline and stable Postop Assessment: no apparent nausea or vomiting Anesthetic complications: no   No notable events documented.  Last Vitals:  Vitals:   05/14/22 1535 05/14/22 1617  BP: (!) 143/81 118/67  Pulse:  82  Resp:  17  Temp: 36.6 C 36.5 C  SpO2:  94%    Last Pain:  Vitals:   05/14/22 1535  TempSrc:   PainSc: 0-No pain                 Effie Berkshire

## 2022-05-14 NOTE — Anesthesia Procedure Notes (Signed)
Procedure Name: Intubation Date/Time: 05/14/2022 11:17 AM  Performed by: Griffin Dakin, CRNAPre-anesthesia Checklist: Patient identified, Emergency Drugs available, Suction available and Patient being monitored Patient Re-evaluated:Patient Re-evaluated prior to induction Oxygen Delivery Method: Circle system utilized Preoxygenation: Pre-oxygenation with 100% oxygen Induction Type: IV induction Ventilation: Mask ventilation without difficulty Laryngoscope Size: Mac and 4 Grade View: Grade I Tube type: Oral Tube size: 7.5 mm Number of attempts: 1 Airway Equipment and Method: Stylet and Oral airway Placement Confirmation: ETT inserted through vocal cords under direct vision, positive ETCO2 and breath sounds checked- equal and bilateral Secured at: 22 cm Tube secured with: Tape Dental Injury: Teeth and Oropharynx as per pre-operative assessment  Comments: K Venrick SRNA

## 2022-05-14 NOTE — Transfer of Care (Signed)
Immediate Anesthesia Transfer of Care Note  Patient: TAMIM SKOG  Procedure(s) Performed: POSTERIOR LATERAL FUSION INSTRUMENTATION - LUMBAR FOUR-LUMBAR FIVE - POSTERIOR LUMBAR INTERBODY FUSION LUMBAR FIVE-SACRAL ONE (Back)  Patient Location: PACU  Anesthesia Type:General  Level of Consciousness: awake, alert  and oriented  Airway & Oxygen Therapy: Patient Spontanous Breathing and Patient connected to face mask oxygen  Post-op Assessment: Report given to RN and Post -op Vital signs reviewed and stable  Post vital signs: Reviewed and stable  Last Vitals:  Vitals Value Taken Time  BP    Temp    Pulse    Resp    SpO2      Last Pain:  Vitals:   05/14/22 1030  TempSrc:   PainSc: 0-No pain      Patients Stated Pain Goal: 0 (48/25/00 3704)  Complications: No notable events documented.

## 2022-05-15 DIAGNOSIS — R69 Illness, unspecified: Secondary | ICD-10-CM | POA: Diagnosis not present

## 2022-05-15 DIAGNOSIS — Z7982 Long term (current) use of aspirin: Secondary | ICD-10-CM | POA: Diagnosis not present

## 2022-05-15 DIAGNOSIS — E039 Hypothyroidism, unspecified: Secondary | ICD-10-CM | POA: Diagnosis not present

## 2022-05-15 DIAGNOSIS — I10 Essential (primary) hypertension: Secondary | ICD-10-CM | POA: Diagnosis not present

## 2022-05-15 DIAGNOSIS — M5127 Other intervertebral disc displacement, lumbosacral region: Secondary | ICD-10-CM | POA: Diagnosis not present

## 2022-05-15 DIAGNOSIS — Z79899 Other long term (current) drug therapy: Secondary | ICD-10-CM | POA: Diagnosis not present

## 2022-05-15 DIAGNOSIS — E119 Type 2 diabetes mellitus without complications: Secondary | ICD-10-CM | POA: Diagnosis not present

## 2022-05-15 DIAGNOSIS — M5417 Radiculopathy, lumbosacral region: Secondary | ICD-10-CM | POA: Diagnosis not present

## 2022-05-15 LAB — GLUCOSE, CAPILLARY: Glucose-Capillary: 192 mg/dL — ABNORMAL HIGH (ref 70–99)

## 2022-05-15 MED ORDER — TAMSULOSIN HCL 0.4 MG PO CAPS: 0.4000 mg | ORAL_CAPSULE | Freq: Every day | ORAL | 0 refills | Status: AC

## 2022-05-15 MED ORDER — HYDROCODONE-ACETAMINOPHEN 7.5-325 MG PO TABS: 1.0000 | ORAL_TABLET | ORAL | 0 refills | Status: AC | PRN

## 2022-05-15 MED ORDER — METHOCARBAMOL 500 MG PO TABS: 500.0000 mg | ORAL_TABLET | Freq: Four times a day (QID) | ORAL | 0 refills | Status: AC | PRN

## 2022-05-15 NOTE — Progress Notes (Signed)
Patient alert and oriented, mae's well, voiding adequate amount of urine, swallowing without difficulty, no c/o pain at time of discharge. Patient discharged home with family. Script and discharged instructions given to patient. Patient and family stated understanding of instructions given. Patient has an appointment with Dr. Jones °

## 2022-05-15 NOTE — Discharge Summary (Signed)
Physician Discharge Summary  Patient ID: Peter Aguilar MRN: [id number] DOB/AGE: 81-Jan-1942 81 y.o.  Admit date: 05/14/2022 Discharge date: 05/15/2022  Admission Diagnoses: recurrent disk herniation with radiculopathy    Discharge Diagnoses: same   Discharged Condition: good  Hospital Course: The patient was admitted on 05/14/2022 and taken to the operating room where the patient underwent L4-S1 fusion. The patient tolerated the procedure well and was taken to the recovery room and then to the floor in stable condition. The hospital course was routine. There were no complications. The wound remained clean dry and intact. Pt had appropriate back soreness. No complaints of leg pain or new N/T/W. The patient remained afebrile with stable vital signs, and tolerated a regular diet. The patient continued to increase activities, and pain was well controlled with oral pain medications.   Consults: None  Significant Diagnostic Studies:  Results for orders placed or performed during the hospital encounter of 05/14/22  Glucose, capillary  Result Value Ref Range   Glucose-Capillary 168 (H) 70 - 99 mg/dL   Comment 1 Notify RN    Comment 2 Document in Chart   Glucose, capillary  Result Value Ref Range   Glucose-Capillary 237 (H) 70 - 99 mg/dL  Glucose, capillary  Result Value Ref Range   Glucose-Capillary 280 (H) 70 - 99 mg/dL   Comment 1 Notify RN    Comment 2 Document in Chart   Glucose, capillary  Result Value Ref Range   Glucose-Capillary 192 (H) 70 - 99 mg/dL   Comment 1 Notify RN    Comment 2 Document in Chart   ABO/Rh  Result Value Ref Range   ABO/RH(D)      A POS Performed at [facility], [address], [place], [place] [address]     DG Lumbar Spine 2-3 Views  Result Date: 05/14/2022 CLINICAL DATA:  Lumbar fusion L4-5 and  L5-S1 EXAM: LUMBAR SPINE - 2-3 VIEW; DG C-ARM 1-60 MIN-NO REPORT COMPARISON:  11/21/2020 FINDINGS: PA and lateral views lumbar spine obtained  with C-arm equipment Bilateral pedicle screw and posterior rod fusion L4-5 and L5-S1 in good position. Interbody spacers L5-S1 in good position. No spacer at L4-5. IMPRESSION: Satisfactory fusion L4-5 and L5-S1 Electronically Signed   By: Marlan Palau M.D.   On: 05/14/2022 15:13   DG C-Arm 1-60 Min-No Report  Result Date: 05/14/2022 Fluoroscopy was utilized by the requesting physician.  No radiographic interpretation.   DG C-Arm 1-60 Min-No Report  Result Date: 05/14/2022 Fluoroscopy was utilized by the requesting physician.  No radiographic interpretation.   DG C-Arm 1-60 Min-No Report  Result Date: 05/14/2022 Fluoroscopy was utilized by the requesting physician.  No radiographic interpretation.    Antibiotics:  Anti-infectives (From admission, onward)    Start     Dose/Rate Route Frequency Ordered Stop   05/14/22 1715  ceFAZolin (ANCEF) IVPB 2g/100 mL premix        2 g 200 mL/hr over 30 Minutes Intravenous Every 8 hours 05/14/22 1622 05/15/22 0045   05/14/22 1030  ceFAZolin (ANCEF) IVPB 2g/100 mL premix        2 g 200 mL/hr over 30 Minutes Intravenous On call to O.R. 05/14/22 1023 05/14/22 1112       Discharge Exam: Blood pressure 135/70, pulse 82, temperature 98.2 F (36.8 C), temperature source Oral, resp. rate 18, height 6' (1.829 m), weight 83.9 kg, SpO2 96 %. Neurologic: Grossly normal Dressing dry  Discharge Medications:   Allergies as of 05/15/2022  Reactions   Metformin And Related Other (See Comments)   Upset stomach   Tetracyclines & Related Rash, Other (See Comments)   Sun exposure caused rash        Medication List     TAKE these medications    amLODipine 5 MG tablet Commonly known as: NORVASC Take 5 mg by mouth daily.   aspirin EC 81 MG tablet Take 81 mg by mouth daily. Swallow whole.   atorvastatin 20 MG tablet Commonly known as: LIPITOR Take 20 mg by mouth every evening.   citalopram 20 MG tablet Commonly known as: CELEXA Take 20  mg by mouth every evening.   CoQ-10 100 MG Caps Take 100 mg by mouth daily.   FISH OIL PO Take 1 capsule by mouth daily.   glimepiride 2 MG tablet Commonly known as: AMARYL Take 2 mg by mouth daily with supper.   HYDROcodone-acetaminophen 7.5-325 MG tablet Commonly known as: NORCO Take 1 tablet by mouth every 4 (four) hours as needed for moderate pain. What changed:  when to take this reasons to take this   ibuprofen 200 MG tablet Commonly known as: ADVIL Take 400 mg by mouth every 6 (six) hours as needed for moderate pain.   Januvia 50 MG tablet Generic drug: sitaGLIPtin Take 50 mg by mouth daily.   levothyroxine 100 MCG tablet Commonly known as: SYNTHROID Take 100 mcg by mouth daily.   lisinopril-hydrochlorothiazide 20-12.5 MG tablet Commonly known as: ZESTORETIC Take 1 tablet by mouth daily.   methocarbamol 500 MG tablet Commonly known as: ROBAXIN Take 1 tablet (500 mg total) by mouth every 6 (six) hours as needed for muscle spasms.   tamsulosin 0.4 MG Caps capsule Commonly known as: FLOMAX Take 1 capsule (0.4 mg total) by mouth daily.               Durable Medical Equipment  (From admission, onward)           Start     Ordered   05/14/22 1623  DME Walker rolling  Once       Question:  Patient needs a walker to treat with the following condition  Answer:  S/P lumbar fusion   05/14/22 1622   05/14/22 1623  DME 3 n 1  Once        05/14/22 1622            Disposition: home   Final Dx: L4-S1 decompression / fusion  Discharge Instructions      Remove dressing in 72 hours   Complete by: As directed    Call MD for:  difficulty breathing, headache or visual disturbances   Complete by: As directed    Call MD for:  persistant nausea and vomiting   Complete by: As directed    Call MD for:  redness, tenderness, or signs of infection (pain, swelling, redness, odor or green/yellow discharge around incision site)   Complete by: As directed     Call MD for:  severe uncontrolled pain   Complete by: As directed    Call MD for:  temperature >100.4   Complete by: As directed    Diet - low sodium heart healthy   Complete by: As directed    Increase activity slowly   Complete by: As directed           Signed: Eustace Moore 05/15/2022, 7:47 AM

## 2022-05-15 NOTE — Evaluation (Signed)
Physical Therapy Evaluation  Patient Details Name: Peter Aguilar MRN: [id number] DOB: 11-30-1940 Today's Date: 05/15/2022  History of Present Illness  Pt is an 81 y.o. male who presents s/p L4-S1 PLIF on 05/14/2022. PMH significant for spinal surgery, BPH, DM, gilbert syndrome, HTN, and ACDF.   Clinical Impression  Pt admitted with above diagnosis. At the time of PT eval, pt was able to demonstrate transfers and ambulation with gross supervision for safety and no AD. Pt was educated on precautions, brace application/wearing schedule, appropriate activity progression, and car transfer. Pt currently with functional limitations due to the deficits listed below (see PT Problem List). Pt will benefit from skilled PT to increase their independence and safety with mobility to allow discharge to the venue listed below.         Recommendations for follow up therapy are one component of a multi-disciplinary discharge planning process, led by the attending physician.  Recommendations may be updated based on patient status, additional functional criteria and insurance authorization.  Follow Up Recommendations No PT follow up      Assistance Recommended at Discharge PRN  Patient can return home with the following  A little help with walking and/or transfers;A little help with bathing/dressing/bathroom;Assistance with cooking/housework;Assist for transportation;Help with stairs or ramp for entrance    Equipment Recommendations None recommended by PT  Recommendations for Other Services       Functional Status Assessment Patient has had a recent decline in their functional status and demonstrates the ability to make significant improvements in function in a reasonable and predictable amount of time.     Precautions / Restrictions Precautions Precautions: Back;Fall Precaution Booklet Issued: Yes (comment) Precaution Comments: Reviewed handout and pt was cued for precautions during functional  mobility. Required Braces or Orthoses: Spinal Brace Spinal Brace: Lumbar corset Restrictions Weight Bearing Restrictions: No      Mobility  Bed Mobility Overal bed mobility: Needs Assistance Bed Mobility: Rolling, Sidelying to Sit Rolling: Supervision Sidelying to sit: Supervision       General bed mobility comments: HOB flat and rails lowered to simulate home environment. VC's throughout for optimal log roll technique.    Transfers Overall transfer level: Needs assistance Equipment used: Rolling walker (2 wheels), None Transfers: Sit to/from Stand Sit to Stand: Supervision           General transfer comment: Supervision as pt powered up to full stand. Good maintenance of precautions throughout transfers.    Ambulation/Gait Ambulation/Gait assistance: Min guard Gait Distance (Feet): 500 Feet Assistive device: Rolling walker (2 wheels), None Gait Pattern/deviations: Step-through pattern, Decreased stride length Gait velocity: Decreased Gait velocity interpretation: 1.31 - 2.62 ft/sec, indicative of limited community ambulator   General Gait Details: Initially with RW for support, however moving quickly and appeared ready to progress to no AD. Pt willing to attempt and successful in ambulating in the hall without UE support. Pt demonstrating improved posture without AD and ambulating without LOB.  Stairs Stairs: Yes Stairs assistance: Min guard Stair Management: One rail Right, Alternating pattern, Forwards Number of Stairs: 10 General stair comments: VC's for general safety. Pt able to demonstrate an alternating step pattern throughout.  Wheelchair Mobility    Modified Rankin (Stroke Patients Only)       Balance Overall balance assessment: Needs assistance Sitting-balance support: No upper extremity supported, Feet supported Sitting balance-Leahy Scale: Good     Standing balance support: Bilateral upper extremity supported, No upper extremity supported,  During functional activity Standing balance-Leahy Scale: Fair  Pertinent Vitals/Pain Pain Assessment Pain Assessment: Faces Faces Pain Scale: Hurts a little bit Pain Location: Incision site Pain Descriptors / Indicators: Sore, Operative site guarding Pain Intervention(s): Limited activity within patient's tolerance, Monitored during session, Repositioned    Home Living Family/patient expects to be discharged to:: Private residence Living Arrangements: Spouse/significant other Available Help at Discharge: Family Type of Home: House Home Access: Stairs to enter Entrance Stairs-Rails: Lawyer of Steps: 4   Home Layout: One level Home Equipment: Agricultural consultant (2 wheels);Cane - single point;Shower seat      Prior Function Prior Level of Function : Independent/Modified Independent             Mobility Comments: Reports recent use of cane ADLs Comments: Reports that he was independent before onset of pain in which wife provided light assist with ADL and IADL. Has been wearing loafers due to inability to tie shoes     Hand Dominance   Dominant Hand: Right    Extremity/Trunk Assessment   Upper Extremity Assessment Upper Extremity Assessment: Overall WFL for tasks assessed    Lower Extremity Assessment Lower Extremity Assessment: Generalized weakness (Mild; consistent with pre-op diagnosis and surgery)    Cervical / Trunk Assessment Cervical / Trunk Assessment: Back Surgery  Communication   Communication: HOH  Cognition Arousal/Alertness: Awake/alert Behavior During Therapy: WFL for tasks assessed/performed Overall Cognitive Status: Within Functional Limits for tasks assessed                                          General Comments General comments (skin integrity, edema, etc.): VSS. Wife present and supportive    Exercises     Assessment/Plan    PT Assessment Patient needs  continued PT services  PT Problem List Decreased strength;Decreased activity tolerance;Decreased balance;Decreased mobility;Decreased knowledge of use of DME;Decreased safety awareness;Decreased knowledge of precautions;Pain       PT Treatment Interventions DME instruction;Gait training;Stair training;Functional mobility training;Therapeutic activities;Therapeutic exercise;Balance training;Patient/family education    PT Goals (Current goals can be found in the Care Plan section)  Acute Rehab PT Goals Patient Stated Goal: Home today PT Goal Formulation: With patient/family Time For Goal Achievement: 05/22/22 Potential to Achieve Goals: Good    Frequency Min 5X/week     Co-evaluation               AM-PAC PT "6 Clicks" Mobility  Outcome Measure Help needed turning from your back to your side while in a flat bed without using bedrails?: A Little Help needed moving from lying on your back to sitting on the side of a flat bed without using bedrails?: A Little Help needed moving to and from a bed to a chair (including a wheelchair)?: A Little Help needed standing up from a chair using your arms (e.g., wheelchair or bedside chair)?: A Little Help needed to walk in hospital room?: A Little Help needed climbing 3-5 steps with a railing? : A Little 6 Click Score: 18    End of Session Equipment Utilized During Treatment: Gait belt;Back brace Activity Tolerance: Patient tolerated treatment well Patient left: in bed;with call bell/phone within reach;with family/visitor present Nurse Communication: Mobility status PT Visit Diagnosis: Unsteadiness on feet (R26.81);Pain Pain - part of body:  (back)    Time: 7564-3329 PT Time Calculation (min) (ACUTE ONLY): 20 min   Charges:   PT Evaluation $PT Eval Low Complexity: 1 Low  Conni Slipper, PT, DPT Acute Rehabilitation Services Secure Chat Preferred Office: [phone]   Marylynn Pearson 05/15/2022, 10:38 AM

## 2022-05-15 NOTE — Discharge Instructions (Signed)
Wound Care Keep incision covered and dry for three days.  If you shower, cover incision with plastic wrap.  Do not put any creams, lotions, or ointments on incision. Leave steri-strips on back.  They will fall off by themselves. Activity Walk each and every day, increasing distance each day. No lifting greater than 5 lbs.  Avoid excessive back bending No driving for 2 weeks; may ride as a passenger locally. If provided with back brace, wear when out of bed.  It is not necessary to wear brace in bed. Diet Resume your normal diet.   Call Your Doctor If Any of These Occur Redness, drainage, or swelling at the wound.  Temperature greater than 101 degrees. Severe pain not relieved by pain medication. Incision starts to come apart. Follow Up Appt Call  [phone]) for problems.  If you have any hardware placed in your spine, you will need an x-ray before your appointment.

## 2022-05-15 NOTE — Evaluation (Signed)
Occupational Therapy Evaluation Patient Details Name: Peter Aguilar MRN: [id number] DOB: [birth year] Today's Date: 05/15/2022   History of Present Illness 81 y.o. male s/p PLIF L5-S1. PMH significant for spinal surgery, BPH, DM, gilbert syndrome, HTN, and ACDF.   Clinical Impression   PTA, pt lived with his wife who had recently been lightly assisting with ADL and IADL due to pt pain level. Upon eval, pt performing UB ADL with set-up and LB ADL with up to Mod A. Able to perform LB ADL with min guard A with AE. Pt educated and demonstrating use of compensatory techniques and AE for bed mobility, brace application, LB dressing, LB bathing, toileting, shower transfer, and grooming. Pt with decreased implementation of precautions, but wife independent cueing and assisting to facilitate greater adherence. All questions answered. No further acute needs identified. Recommend discharge home with no OT follow up at this time.      Recommendations for follow up therapy are one component of a multi-disciplinary discharge planning process, led by the attending physician.  Recommendations may be updated based on patient status, additional functional criteria and insurance authorization.   Follow Up Recommendations  No OT follow up    Assistance Recommended at Discharge Intermittent Supervision/Assistance  Patient can return home with the following A little help with walking and/or transfers;A little help with bathing/dressing/bathroom;Assistance with cooking/housework;Assist for transportation;Help with stairs or ramp for entrance    Functional Status Assessment  Patient has had a recent decline in their functional status and demonstrates the ability to make significant improvements in function in a reasonable and predictable amount of time.  Equipment Recommendations  None recommended by OT    Recommendations for Other Services       Precautions / Restrictions Precautions Precautions:  Back Precaution Booklet Issued: Yes (comment) Precaution Comments: All education reviewed and precautions reinforced during ADL Required Braces or Orthoses: Spinal Brace Spinal Brace: Lumbar corset Restrictions Weight Bearing Restrictions: No      Mobility Bed Mobility Overal bed mobility: Needs Assistance Bed Mobility: Rolling, Sidelying to Sit Rolling: Supervision Sidelying to sit: Min guard       General bed mobility comments: Cues for technique to maintain precautions    Transfers Overall transfer level: Needs assistance Equipment used: Rolling walker (2 wheels) Transfers: Sit to/from Stand Sit to Stand: Min guard           General transfer comment: min guard A-supervision for safety      Balance Overall balance assessment: Needs assistance Sitting-balance support: No upper extremity supported, Feet supported Sitting balance-Leahy Scale: Good     Standing balance support: Bilateral upper extremity supported, No upper extremity supported, During functional activity Standing balance-Leahy Scale: Fair Standing balance comment: min guard A when donning shirt standing                           ADL either performed or assessed with clinical judgement   ADL Overall ADL's : Needs assistance/impaired Eating/Feeding: Modified independent;Sitting   Grooming: Supervision/safety;Standing Grooming Details (indicate cue type and reason): educated on use of compensatory techniques Upper Body Bathing: Set up;Sitting   Lower Body Bathing: Min guard;Sit to/from stand   Upper Body Dressing : Sitting;Minimal assistance Upper Body Dressing Details (indicate cue type and reason): for brace application. Wife independent assisting after initial trial Lower Body Dressing: Moderate assistance Lower Body Dressing Details (indicate cue type and reason): Mod A to thread pants and don socks. Educated regarding AE, but  pt did not wish to attempt this session Toilet  Transfer: Min guard;Rolling walker (2 wheels);Ambulation;Regular Toilet     Toileting - Clothing Manipulation Details (indicate cue type and reason): educating regarding compensatory techniques and AE Tub/ Shower Transfer: Walk-in shower;Ambulation;Shower seat;Rolling walker (2 wheels);Min guard Clinical cytogeneticist Details (indicate cue type and reason): min guard A for safety Functional mobility during ADLs: Min guard;Rolling walker (2 wheels)       Vision Patient Visual Report: No change from baseline Vision Assessment?: No apparent visual deficits     Perception     Praxis      Pertinent Vitals/Pain Pain Assessment Pain Assessment: Faces Faces Pain Scale: Hurts little more Pain Location: operative site Pain Descriptors / Indicators: Sore, Operative site guarding Pain Intervention(s): Limited activity within patient's tolerance, Monitored during session     Hand Dominance     Extremity/Trunk Assessment Upper Extremity Assessment Upper Extremity Assessment: Overall WFL for tasks assessed   Lower Extremity Assessment Lower Extremity Assessment: Defer to PT evaluation   Cervical / Trunk Assessment Cervical / Trunk Assessment: Back Surgery   Communication Communication Communication: No difficulties (hard of hearing)   Cognition Arousal/Alertness: Awake/alert Behavior During Therapy: WFL for tasks assessed/performed Overall Cognitive Status: Within Functional Limits for tasks assessed                                 General Comments: Able to recall 3/3 spinal precautions after initial education. Min cues to maintain throughout. Wife independent cueing     General Comments  VSS. Wife present and supportive    Exercises     Shoulder Instructions      Home Living Family/patient expects to be discharged to:: Private residence Living Arrangements: Spouse/significant other Available Help at Discharge: Family Type of Home: House Home Access: Stairs  to enter Technical brewer of Steps: 4 Entrance Stairs-Rails: Left;Right Home Layout: One level     Bathroom Shower/Tub: Astronomer Accessibility: Yes How Accessible: Accessible via [place]: Conservation officer, nature (2 wheels);Cane - single point;Shower seat          Prior Functioning/Environment Prior Level of Function : Independent/Modified Independent             Mobility Comments: Reports recent use of cane ADLs Comments: Reports that he was independent before onset of pain in which wife provided light assist with ADL and IADL. Has been wearing loafers due to inability to tie shoes        OT Problem List: Decreased strength;Decreased activity tolerance;Impaired balance (sitting and/or standing);Decreased knowledge of precautions;Decreased knowledge of use of DME or AE;Pain      OT Treatment/Interventions:      OT Goals(Current goals can be found in the care plan section) Acute Rehab OT Goals Patient Stated Goal: go home OT Goal Formulation: With patient  OT Frequency:      Co-evaluation              AM-PAC OT "6 Clicks" Daily Activity     Outcome Measure Help from another person eating meals?: None Help from another person taking care of personal grooming?: A Little Help from another person toileting, which includes using toliet, bedpan, or urinal?: A Little Help from another person bathing (including washing, rinsing, drying)?: A Little Help from another person to put on and taking off regular upper body clothing?: A Little Help from another person to put on and  taking off regular lower body clothing?: A Lot 6 Click Score: 18   End of Session Equipment Utilized During Treatment: Gait belt;Rolling walker (2 wheels);Back brace Nurse Communication: Mobility status  Activity Tolerance: Patient tolerated treatment well Patient left: in bed;with call bell/phone within reach;with family/visitor present  OT Visit Diagnosis:  Unsteadiness on feet (R26.81);Muscle weakness (generalized) (M62.81);Pain Pain - part of body:  (back)                Time: 6962-9528 OT Time Calculation (min): 29 min Charges:  OT General Charges $OT Visit: 1 Visit OT Evaluation $OT Eval Low Complexity: 1 Low OT Treatments $Self Care/Home Management : 8-22 mins  Ladene Artist, OTR/L [facility] Acute Rehabilitation Office: [phone]   Drue Novel 05/15/2022, 9:43 AM

## 2022-06-26 DIAGNOSIS — M5417 Radiculopathy, lumbosacral region: Secondary | ICD-10-CM | POA: Diagnosis not present

## 2022-06-26 DIAGNOSIS — M5126 Other intervertebral disc displacement, lumbar region: Secondary | ICD-10-CM | POA: Diagnosis not present

## 2022-10-16 DIAGNOSIS — R69 Illness, unspecified: Secondary | ICD-10-CM | POA: Diagnosis not present

## 2022-10-16 DIAGNOSIS — E1122 Type 2 diabetes mellitus with diabetic chronic kidney disease: Secondary | ICD-10-CM | POA: Diagnosis not present

## 2022-10-16 DIAGNOSIS — E213 Hyperparathyroidism, unspecified: Secondary | ICD-10-CM | POA: Diagnosis not present

## 2022-10-16 DIAGNOSIS — M519 Unspecified thoracic, thoracolumbar and lumbosacral intervertebral disc disorder: Secondary | ICD-10-CM | POA: Diagnosis not present

## 2022-10-16 DIAGNOSIS — I10 Essential (primary) hypertension: Secondary | ICD-10-CM | POA: Diagnosis not present

## 2022-10-16 DIAGNOSIS — E782 Mixed hyperlipidemia: Secondary | ICD-10-CM | POA: Diagnosis not present

## 2022-10-16 DIAGNOSIS — N182 Chronic kidney disease, stage 2 (mild): Secondary | ICD-10-CM | POA: Diagnosis not present

## 2022-10-16 DIAGNOSIS — N400 Enlarged prostate without lower urinary tract symptoms: Secondary | ICD-10-CM | POA: Diagnosis not present

## 2022-10-16 DIAGNOSIS — Z0000 Encounter for general adult medical examination without abnormal findings: Secondary | ICD-10-CM | POA: Diagnosis not present

## 2022-10-16 DIAGNOSIS — E039 Hypothyroidism, unspecified: Secondary | ICD-10-CM | POA: Diagnosis not present

## 2022-10-16 DIAGNOSIS — I700 Atherosclerosis of aorta: Secondary | ICD-10-CM | POA: Diagnosis not present

## 2022-10-16 DIAGNOSIS — Z1331 Encounter for screening for depression: Secondary | ICD-10-CM | POA: Diagnosis not present

## 2023-09-21 DIAGNOSIS — H5203 Hypermetropia, bilateral: Secondary | ICD-10-CM | POA: Diagnosis not present

## 2023-09-21 DIAGNOSIS — E119 Type 2 diabetes mellitus without complications: Secondary | ICD-10-CM | POA: Diagnosis not present

## 2023-09-21 DIAGNOSIS — H53143 Visual discomfort, bilateral: Secondary | ICD-10-CM | POA: Diagnosis not present

## 2023-12-12 DIAGNOSIS — E782 Mixed hyperlipidemia: Secondary | ICD-10-CM | POA: Diagnosis not present

## 2023-12-12 DIAGNOSIS — N182 Chronic kidney disease, stage 2 (mild): Secondary | ICD-10-CM | POA: Diagnosis not present

## 2023-12-12 DIAGNOSIS — F331 Major depressive disorder, recurrent, moderate: Secondary | ICD-10-CM | POA: Diagnosis not present

## 2023-12-12 DIAGNOSIS — N400 Enlarged prostate without lower urinary tract symptoms: Secondary | ICD-10-CM | POA: Diagnosis not present

## 2024-01-11 DIAGNOSIS — F331 Major depressive disorder, recurrent, moderate: Secondary | ICD-10-CM | POA: Diagnosis not present

## 2024-01-11 DIAGNOSIS — N400 Enlarged prostate without lower urinary tract symptoms: Secondary | ICD-10-CM | POA: Diagnosis not present

## 2024-01-11 DIAGNOSIS — N182 Chronic kidney disease, stage 2 (mild): Secondary | ICD-10-CM | POA: Diagnosis not present

## 2024-01-11 DIAGNOSIS — E782 Mixed hyperlipidemia: Secondary | ICD-10-CM | POA: Diagnosis not present

## 2024-01-28 DIAGNOSIS — L988 Other specified disorders of the skin and subcutaneous tissue: Secondary | ICD-10-CM | POA: Diagnosis not present

## 2024-01-28 DIAGNOSIS — D0472 Carcinoma in situ of skin of left lower limb, including hip: Secondary | ICD-10-CM | POA: Diagnosis not present

## 2024-01-28 DIAGNOSIS — L821 Other seborrheic keratosis: Secondary | ICD-10-CM | POA: Diagnosis not present

## 2024-01-28 DIAGNOSIS — C4339 Malignant melanoma of other parts of face: Secondary | ICD-10-CM | POA: Diagnosis not present

## 2024-01-28 DIAGNOSIS — L570 Actinic keratosis: Secondary | ICD-10-CM | POA: Diagnosis not present

## 2024-01-28 DIAGNOSIS — C44519 Basal cell carcinoma of skin of other part of trunk: Secondary | ICD-10-CM | POA: Diagnosis not present

## 2024-01-28 DIAGNOSIS — L720 Epidermal cyst: Secondary | ICD-10-CM | POA: Diagnosis not present

## 2024-01-28 DIAGNOSIS — C44319 Basal cell carcinoma of skin of other parts of face: Secondary | ICD-10-CM | POA: Diagnosis not present

## 2024-01-28 DIAGNOSIS — C4441 Basal cell carcinoma of skin of scalp and neck: Secondary | ICD-10-CM | POA: Diagnosis not present

## 2024-01-28 DIAGNOSIS — D485 Neoplasm of uncertain behavior of skin: Secondary | ICD-10-CM | POA: Diagnosis not present

## 2024-02-15 DIAGNOSIS — C44319 Basal cell carcinoma of skin of other parts of face: Secondary | ICD-10-CM | POA: Diagnosis not present

## 2024-02-15 DIAGNOSIS — Z85820 Personal history of malignant melanoma of skin: Secondary | ICD-10-CM | POA: Diagnosis not present

## 2024-02-15 DIAGNOSIS — C4339 Malignant melanoma of other parts of face: Secondary | ICD-10-CM | POA: Diagnosis not present

## 2024-02-22 DIAGNOSIS — Z85820 Personal history of malignant melanoma of skin: Secondary | ICD-10-CM | POA: Diagnosis not present

## 2024-02-22 DIAGNOSIS — D485 Neoplasm of uncertain behavior of skin: Secondary | ICD-10-CM | POA: Diagnosis not present

## 2024-02-22 DIAGNOSIS — D487 Neoplasm of uncertain behavior of other specified sites: Secondary | ICD-10-CM | POA: Diagnosis not present

## 2024-03-13 DIAGNOSIS — N182 Chronic kidney disease, stage 2 (mild): Secondary | ICD-10-CM | POA: Diagnosis not present

## 2024-03-13 DIAGNOSIS — F331 Major depressive disorder, recurrent, moderate: Secondary | ICD-10-CM | POA: Diagnosis not present

## 2024-03-13 DIAGNOSIS — E782 Mixed hyperlipidemia: Secondary | ICD-10-CM | POA: Diagnosis not present

## 2024-03-13 DIAGNOSIS — N400 Enlarged prostate without lower urinary tract symptoms: Secondary | ICD-10-CM | POA: Diagnosis not present

## 2024-03-22 DIAGNOSIS — Z85820 Personal history of malignant melanoma of skin: Secondary | ICD-10-CM | POA: Diagnosis not present

## 2024-03-22 DIAGNOSIS — C44519 Basal cell carcinoma of skin of other part of trunk: Secondary | ICD-10-CM | POA: Diagnosis not present

## 2024-03-29 DIAGNOSIS — C44319 Basal cell carcinoma of skin of other parts of face: Secondary | ICD-10-CM | POA: Diagnosis not present

## 2024-03-29 DIAGNOSIS — Z85820 Personal history of malignant melanoma of skin: Secondary | ICD-10-CM | POA: Diagnosis not present

## 2024-04-12 DIAGNOSIS — F331 Major depressive disorder, recurrent, moderate: Secondary | ICD-10-CM | POA: Diagnosis not present

## 2024-04-12 DIAGNOSIS — N400 Enlarged prostate without lower urinary tract symptoms: Secondary | ICD-10-CM | POA: Diagnosis not present

## 2024-04-12 DIAGNOSIS — N182 Chronic kidney disease, stage 2 (mild): Secondary | ICD-10-CM | POA: Diagnosis not present

## 2024-04-12 DIAGNOSIS — E782 Mixed hyperlipidemia: Secondary | ICD-10-CM | POA: Diagnosis not present

## 2024-04-25 DIAGNOSIS — Z85820 Personal history of malignant melanoma of skin: Secondary | ICD-10-CM | POA: Diagnosis not present

## 2024-04-25 DIAGNOSIS — L821 Other seborrheic keratosis: Secondary | ICD-10-CM | POA: Diagnosis not present

## 2024-04-25 DIAGNOSIS — D1801 Hemangioma of skin and subcutaneous tissue: Secondary | ICD-10-CM | POA: Diagnosis not present

## 2024-04-25 DIAGNOSIS — C44612 Basal cell carcinoma of skin of right upper limb, including shoulder: Secondary | ICD-10-CM | POA: Diagnosis not present

## 2024-04-25 DIAGNOSIS — Z85828 Personal history of other malignant neoplasm of skin: Secondary | ICD-10-CM | POA: Diagnosis not present

## 2024-04-25 DIAGNOSIS — D0472 Carcinoma in situ of skin of left lower limb, including hip: Secondary | ICD-10-CM | POA: Diagnosis not present

## 2024-05-09 DIAGNOSIS — L720 Epidermal cyst: Secondary | ICD-10-CM | POA: Diagnosis not present

## 2024-05-09 DIAGNOSIS — Z85820 Personal history of malignant melanoma of skin: Secondary | ICD-10-CM | POA: Diagnosis not present

## 2024-05-13 DIAGNOSIS — N182 Chronic kidney disease, stage 2 (mild): Secondary | ICD-10-CM | POA: Diagnosis not present

## 2024-05-13 DIAGNOSIS — F331 Major depressive disorder, recurrent, moderate: Secondary | ICD-10-CM | POA: Diagnosis not present

## 2024-05-13 DIAGNOSIS — E782 Mixed hyperlipidemia: Secondary | ICD-10-CM | POA: Diagnosis not present

## 2024-05-13 DIAGNOSIS — N400 Enlarged prostate without lower urinary tract symptoms: Secondary | ICD-10-CM | POA: Diagnosis not present

## 2024-07-04 NOTE — Progress Notes (Addendum)
 Peter Aguilar                                          MRN: [id number]   07/26/2024   The [facility] Quality Team Specialist reviewed this patient medical record for the purposes of chart review for care gap closure. The following were reviewed: chart review for care gap closure-controlling blood pressure.    [facility] Quality Team
